# Patient Record
Sex: Female | Born: 1975 | Race: White | Hispanic: No | Marital: Single | Smoking: Current every day smoker
Health system: Southern US, Community
[De-identification: ages and names within clinical notes are randomized; demographics above are authoritative.]

## PROBLEM LIST (undated history)

## (undated) DIAGNOSIS — M47819 Spondylosis without myelopathy or radiculopathy, site unspecified: Secondary | ICD-10-CM

## (undated) DIAGNOSIS — E079 Disorder of thyroid, unspecified: Secondary | ICD-10-CM

## (undated) DIAGNOSIS — F319 Bipolar disorder, unspecified: Secondary | ICD-10-CM

## (undated) DIAGNOSIS — M545 Low back pain, unspecified: Secondary | ICD-10-CM

## (undated) DIAGNOSIS — F1110 Opioid abuse, uncomplicated: Secondary | ICD-10-CM

## (undated) DIAGNOSIS — K219 Gastro-esophageal reflux disease without esophagitis: Secondary | ICD-10-CM

## (undated) DIAGNOSIS — M1990 Unspecified osteoarthritis, unspecified site: Secondary | ICD-10-CM

## (undated) DIAGNOSIS — F32A Depression, unspecified: Secondary | ICD-10-CM

## (undated) DIAGNOSIS — J449 Chronic obstructive pulmonary disease, unspecified: Secondary | ICD-10-CM

## (undated) DIAGNOSIS — G8929 Other chronic pain: Secondary | ICD-10-CM

## (undated) DIAGNOSIS — F419 Anxiety disorder, unspecified: Secondary | ICD-10-CM

## (undated) DIAGNOSIS — E538 Deficiency of other specified B group vitamins: Secondary | ICD-10-CM

## (undated) HISTORY — DX: Opioid abuse, uncomplicated: F11.10

## (undated) HISTORY — PX: TUBAL LIGATION: SHX77

## (undated) HISTORY — PX: CRYOABLATION: SHX1415

## (undated) HISTORY — DX: Disorder of thyroid, unspecified: E07.9

## (undated) HISTORY — DX: Deficiency of other specified B group vitamins: E53.8

## (undated) HISTORY — DX: Anxiety disorder, unspecified: F41.9

## (undated) HISTORY — DX: Depression, unspecified: F32.A

## (undated) HISTORY — PX: DENTAL SURGERY: SHX609

## (undated) HISTORY — DX: Spondylosis without myelopathy or radiculopathy, site unspecified: M47.819

## (undated) HISTORY — DX: Bipolar disorder, unspecified: F31.9

---

## 1998-05-20 ENCOUNTER — Emergency Department (HOSPITAL_COMMUNITY): Admission: EM | Admit: 1998-05-20 | Discharge: 1998-05-20 | Payer: Self-pay | Admitting: Emergency Medicine

## 2000-06-30 ENCOUNTER — Encounter: Payer: Self-pay | Admitting: Preventative Medicine

## 2000-06-30 ENCOUNTER — Ambulatory Visit (HOSPITAL_COMMUNITY): Admission: RE | Admit: 2000-06-30 | Discharge: 2000-06-30 | Payer: Self-pay | Admitting: Preventative Medicine

## 2001-01-31 ENCOUNTER — Emergency Department (HOSPITAL_COMMUNITY): Admission: EM | Admit: 2001-01-31 | Discharge: 2001-01-31 | Payer: Self-pay | Admitting: Emergency Medicine

## 2001-11-14 ENCOUNTER — Emergency Department (HOSPITAL_COMMUNITY): Admission: EM | Admit: 2001-11-14 | Discharge: 2001-11-14 | Payer: Self-pay | Admitting: Emergency Medicine

## 2002-08-06 ENCOUNTER — Emergency Department (HOSPITAL_COMMUNITY): Admission: EM | Admit: 2002-08-06 | Discharge: 2002-08-06 | Payer: Self-pay | Admitting: Internal Medicine

## 2002-09-30 ENCOUNTER — Emergency Department (HOSPITAL_COMMUNITY): Admission: EM | Admit: 2002-09-30 | Discharge: 2002-09-30 | Payer: Self-pay | Admitting: *Deleted

## 2002-11-11 ENCOUNTER — Emergency Department (HOSPITAL_COMMUNITY): Admission: EM | Admit: 2002-11-11 | Discharge: 2002-11-11 | Payer: Self-pay | Admitting: Emergency Medicine

## 2002-12-22 ENCOUNTER — Emergency Department (HOSPITAL_COMMUNITY): Admission: EM | Admit: 2002-12-22 | Discharge: 2002-12-23 | Payer: Self-pay | Admitting: Emergency Medicine

## 2003-03-10 ENCOUNTER — Emergency Department (HOSPITAL_COMMUNITY): Admission: EM | Admit: 2003-03-10 | Discharge: 2003-03-10 | Payer: Self-pay | Admitting: Emergency Medicine

## 2003-10-13 ENCOUNTER — Emergency Department (HOSPITAL_COMMUNITY): Admission: EM | Admit: 2003-10-13 | Discharge: 2003-10-14 | Payer: Self-pay | Admitting: Emergency Medicine

## 2003-11-19 ENCOUNTER — Emergency Department (HOSPITAL_COMMUNITY): Admission: EM | Admit: 2003-11-19 | Discharge: 2003-11-19 | Payer: Self-pay | Admitting: *Deleted

## 2004-04-28 ENCOUNTER — Inpatient Hospital Stay (HOSPITAL_COMMUNITY): Admission: RE | Admit: 2004-04-28 | Discharge: 2004-04-30 | Payer: Self-pay | Admitting: Obstetrics and Gynecology

## 2004-05-12 ENCOUNTER — Emergency Department (HOSPITAL_COMMUNITY): Admission: EM | Admit: 2004-05-12 | Discharge: 2004-05-12 | Payer: Self-pay | Admitting: Emergency Medicine

## 2004-05-30 ENCOUNTER — Emergency Department (HOSPITAL_COMMUNITY): Admission: EM | Admit: 2004-05-30 | Discharge: 2004-05-30 | Payer: Self-pay | Admitting: Emergency Medicine

## 2004-06-17 ENCOUNTER — Emergency Department (HOSPITAL_COMMUNITY): Admission: EM | Admit: 2004-06-17 | Discharge: 2004-06-17 | Payer: Self-pay | Admitting: Emergency Medicine

## 2004-08-18 ENCOUNTER — Emergency Department (HOSPITAL_COMMUNITY): Admission: EM | Admit: 2004-08-18 | Discharge: 2004-08-18 | Payer: Self-pay | Admitting: Emergency Medicine

## 2004-09-08 ENCOUNTER — Ambulatory Visit (HOSPITAL_COMMUNITY): Admission: RE | Admit: 2004-09-08 | Discharge: 2004-09-08 | Payer: Self-pay | Admitting: General Surgery

## 2004-09-08 ENCOUNTER — Encounter: Payer: Self-pay | Admitting: Orthopedic Surgery

## 2005-01-17 ENCOUNTER — Observation Stay (HOSPITAL_COMMUNITY): Admission: EM | Admit: 2005-01-17 | Discharge: 2005-01-18 | Payer: Self-pay | Admitting: Emergency Medicine

## 2005-04-22 ENCOUNTER — Ambulatory Visit (HOSPITAL_COMMUNITY): Admission: AD | Admit: 2005-04-22 | Discharge: 2005-04-22 | Payer: Self-pay | Admitting: Obstetrics and Gynecology

## 2005-04-25 ENCOUNTER — Emergency Department (HOSPITAL_COMMUNITY): Admission: EM | Admit: 2005-04-25 | Discharge: 2005-04-25 | Payer: Self-pay | Admitting: Emergency Medicine

## 2005-05-12 ENCOUNTER — Inpatient Hospital Stay (HOSPITAL_COMMUNITY): Admission: AD | Admit: 2005-05-12 | Discharge: 2005-05-14 | Payer: Self-pay | Admitting: Obstetrics and Gynecology

## 2005-05-17 ENCOUNTER — Emergency Department (HOSPITAL_COMMUNITY): Admission: EM | Admit: 2005-05-17 | Discharge: 2005-05-17 | Payer: Self-pay | Admitting: Emergency Medicine

## 2005-06-14 ENCOUNTER — Ambulatory Visit (HOSPITAL_COMMUNITY): Admission: RE | Admit: 2005-06-14 | Discharge: 2005-06-14 | Payer: Self-pay | Admitting: Obstetrics and Gynecology

## 2005-11-26 ENCOUNTER — Emergency Department (HOSPITAL_COMMUNITY): Admission: EM | Admit: 2005-11-26 | Discharge: 2005-11-26 | Payer: Self-pay | Admitting: Emergency Medicine

## 2006-03-13 ENCOUNTER — Emergency Department (HOSPITAL_COMMUNITY): Admission: EM | Admit: 2006-03-13 | Discharge: 2006-03-13 | Payer: Self-pay | Admitting: Emergency Medicine

## 2006-03-19 ENCOUNTER — Emergency Department (HOSPITAL_COMMUNITY): Admission: EM | Admit: 2006-03-19 | Discharge: 2006-03-20 | Payer: Self-pay | Admitting: Emergency Medicine

## 2006-03-20 ENCOUNTER — Emergency Department (HOSPITAL_COMMUNITY): Admission: EM | Admit: 2006-03-20 | Discharge: 2006-03-20 | Payer: Self-pay | Admitting: Emergency Medicine

## 2006-04-23 ENCOUNTER — Emergency Department (HOSPITAL_COMMUNITY): Admission: EM | Admit: 2006-04-23 | Discharge: 2006-04-23 | Payer: Self-pay | Admitting: Emergency Medicine

## 2006-05-05 ENCOUNTER — Emergency Department (HOSPITAL_COMMUNITY): Admission: EM | Admit: 2006-05-05 | Discharge: 2006-05-05 | Payer: Self-pay | Admitting: Emergency Medicine

## 2006-06-03 ENCOUNTER — Emergency Department (HOSPITAL_COMMUNITY): Admission: EM | Admit: 2006-06-03 | Discharge: 2006-06-03 | Payer: Self-pay | Admitting: Emergency Medicine

## 2006-06-27 ENCOUNTER — Emergency Department (HOSPITAL_COMMUNITY): Admission: EM | Admit: 2006-06-27 | Discharge: 2006-06-27 | Payer: Self-pay | Admitting: Emergency Medicine

## 2006-07-15 ENCOUNTER — Emergency Department (HOSPITAL_COMMUNITY): Admission: EM | Admit: 2006-07-15 | Discharge: 2006-07-15 | Payer: Self-pay | Admitting: Emergency Medicine

## 2006-07-30 ENCOUNTER — Emergency Department (HOSPITAL_COMMUNITY): Admission: EM | Admit: 2006-07-30 | Discharge: 2006-07-30 | Payer: Self-pay | Admitting: Emergency Medicine

## 2006-12-09 ENCOUNTER — Emergency Department (HOSPITAL_COMMUNITY): Admission: EM | Admit: 2006-12-09 | Discharge: 2006-12-10 | Payer: Self-pay | Admitting: Emergency Medicine

## 2007-03-11 ENCOUNTER — Ambulatory Visit: Payer: Self-pay | Admitting: Orthopedic Surgery

## 2007-05-23 ENCOUNTER — Encounter
Admission: RE | Admit: 2007-05-23 | Discharge: 2007-05-23 | Payer: Self-pay | Admitting: Physical Medicine & Rehabilitation

## 2007-07-05 ENCOUNTER — Emergency Department (HOSPITAL_COMMUNITY): Admission: EM | Admit: 2007-07-05 | Discharge: 2007-07-05 | Payer: Self-pay | Admitting: Emergency Medicine

## 2007-10-31 ENCOUNTER — Emergency Department (HOSPITAL_COMMUNITY): Admission: EM | Admit: 2007-10-31 | Discharge: 2007-10-31 | Payer: Self-pay | Admitting: Emergency Medicine

## 2007-11-20 ENCOUNTER — Emergency Department (HOSPITAL_COMMUNITY): Admission: EM | Admit: 2007-11-20 | Discharge: 2007-11-20 | Payer: Self-pay | Admitting: Emergency Medicine

## 2008-02-03 ENCOUNTER — Emergency Department (HOSPITAL_COMMUNITY): Admission: EM | Admit: 2008-02-03 | Discharge: 2008-02-03 | Payer: Self-pay | Admitting: Emergency Medicine

## 2008-05-04 ENCOUNTER — Emergency Department (HOSPITAL_COMMUNITY): Admission: EM | Admit: 2008-05-04 | Discharge: 2008-05-04 | Payer: Self-pay | Admitting: Emergency Medicine

## 2008-05-19 ENCOUNTER — Emergency Department (HOSPITAL_COMMUNITY): Admission: EM | Admit: 2008-05-19 | Discharge: 2008-05-19 | Payer: Self-pay | Admitting: Emergency Medicine

## 2008-05-26 ENCOUNTER — Emergency Department (HOSPITAL_COMMUNITY): Admission: EM | Admit: 2008-05-26 | Discharge: 2008-05-26 | Payer: Self-pay | Admitting: Emergency Medicine

## 2008-07-28 ENCOUNTER — Emergency Department (HOSPITAL_COMMUNITY): Admission: EM | Admit: 2008-07-28 | Discharge: 2008-07-28 | Payer: Self-pay | Admitting: Emergency Medicine

## 2008-09-23 ENCOUNTER — Emergency Department (HOSPITAL_COMMUNITY): Admission: EM | Admit: 2008-09-23 | Discharge: 2008-09-23 | Payer: Self-pay | Admitting: Emergency Medicine

## 2008-09-29 ENCOUNTER — Emergency Department (HOSPITAL_COMMUNITY): Admission: EM | Admit: 2008-09-29 | Discharge: 2008-09-29 | Payer: Self-pay | Admitting: Emergency Medicine

## 2008-11-10 ENCOUNTER — Emergency Department (HOSPITAL_COMMUNITY): Admission: EM | Admit: 2008-11-10 | Discharge: 2008-11-10 | Payer: Self-pay | Admitting: Emergency Medicine

## 2009-02-27 ENCOUNTER — Emergency Department (HOSPITAL_COMMUNITY): Admission: EM | Admit: 2009-02-27 | Discharge: 2009-02-27 | Payer: Self-pay | Admitting: Emergency Medicine

## 2009-04-28 ENCOUNTER — Emergency Department (HOSPITAL_COMMUNITY): Admission: EM | Admit: 2009-04-28 | Discharge: 2009-04-28 | Payer: Self-pay | Admitting: Emergency Medicine

## 2009-04-29 ENCOUNTER — Emergency Department (HOSPITAL_COMMUNITY): Admission: EM | Admit: 2009-04-29 | Discharge: 2009-04-29 | Payer: Self-pay | Admitting: Emergency Medicine

## 2009-06-03 ENCOUNTER — Emergency Department (HOSPITAL_COMMUNITY): Admission: EM | Admit: 2009-06-03 | Discharge: 2009-06-03 | Payer: Self-pay | Admitting: Emergency Medicine

## 2010-01-25 IMAGING — CR DG CHEST 2V
2 series · 2 of 2 positions shown · non-contrast
Comparison: 05/19/2008

CLINICAL DATA: Sore throat.  Cough.  Fever.

CHEST - 2 VIEW

[view not recorded (1 of 2)]
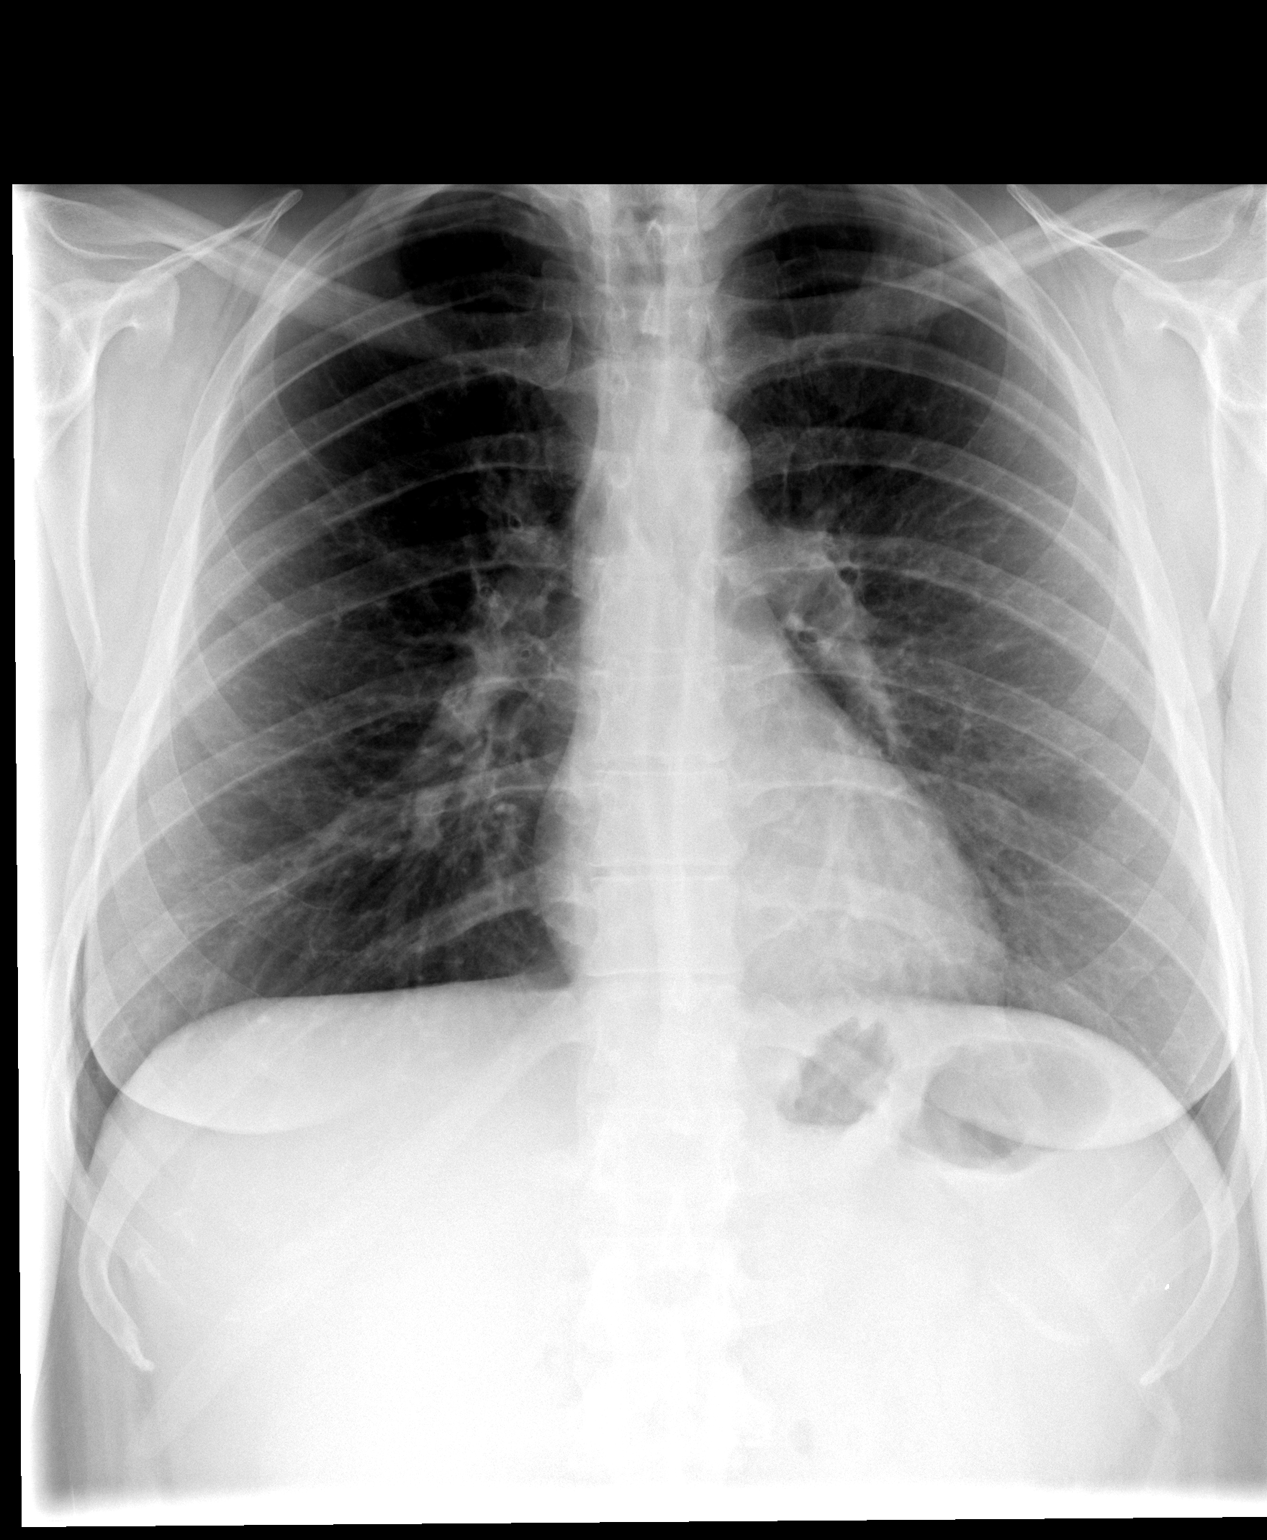

[view not recorded (2 of 2)]
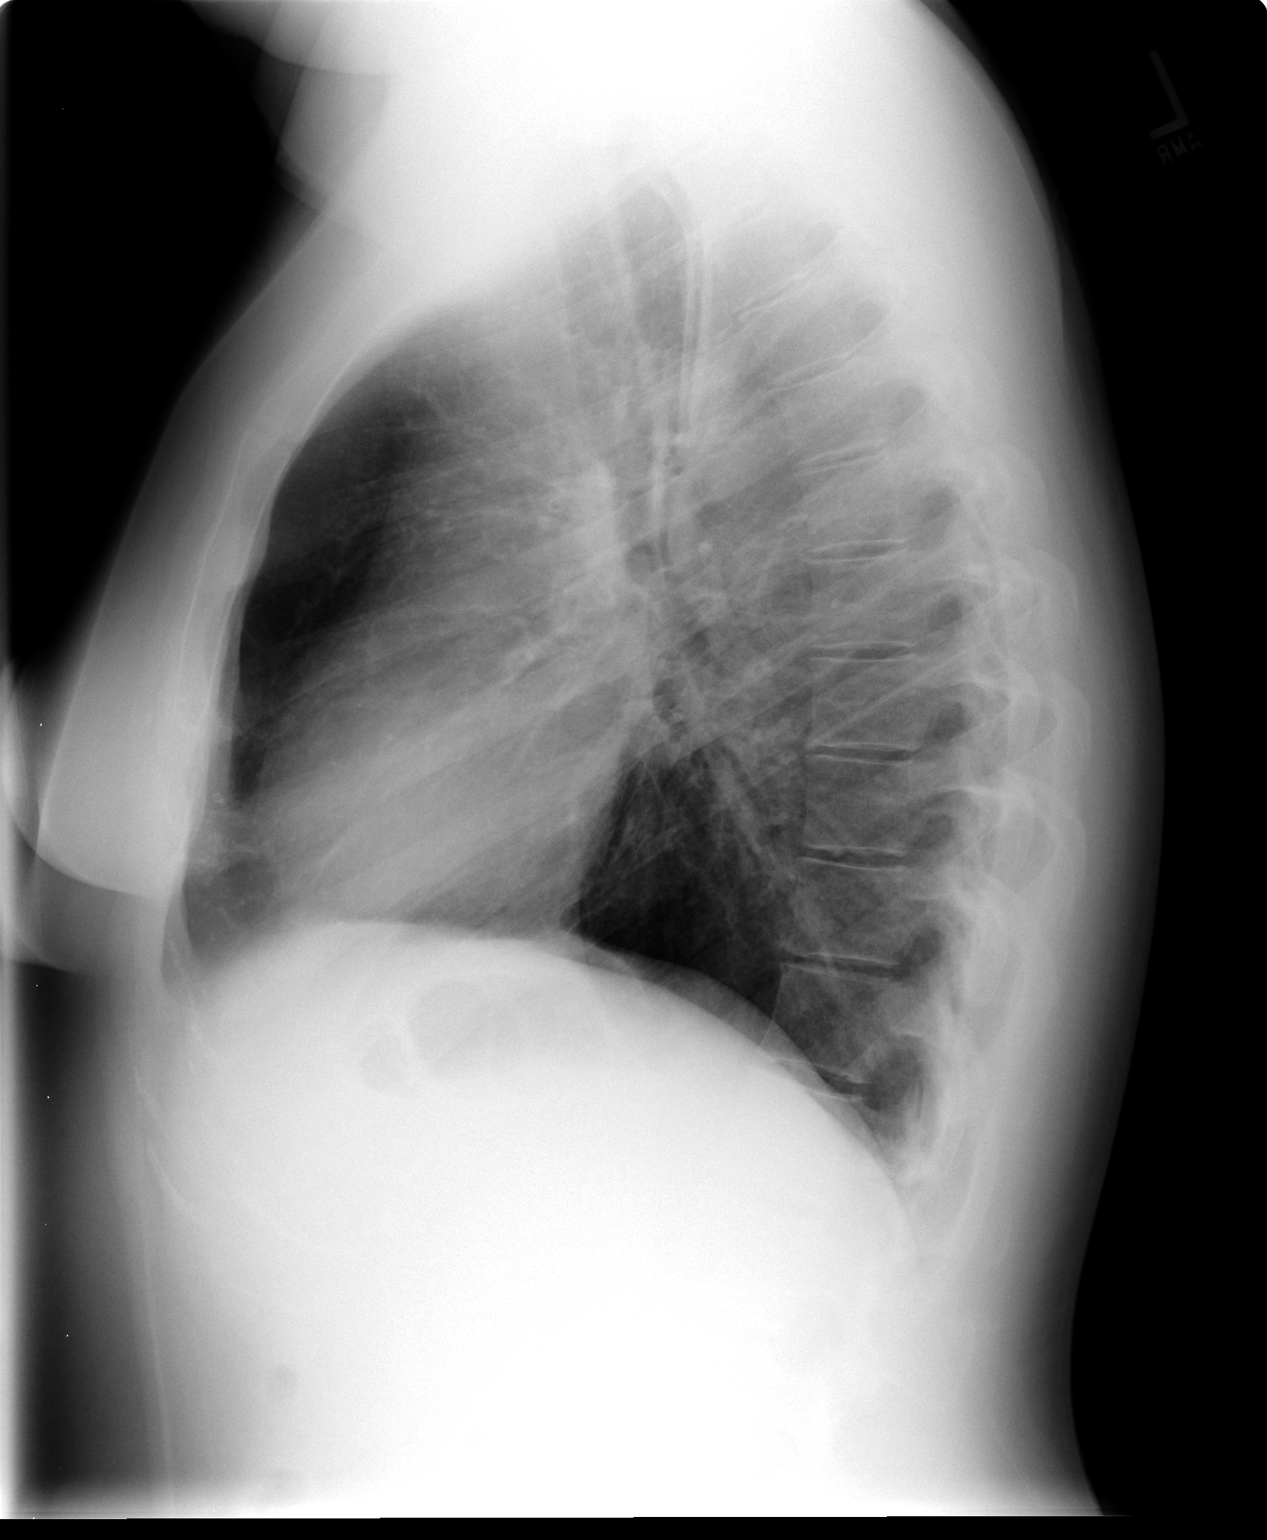

[2 of 2 positions shown; findings below may reference images not displayed]

FINDINGS: Moderate hyperaeration of the lungs.  Findings may
represent COPD.  No infiltrate, consolidation, or atelectasis.
Normal cardiomediastinal silhouette.
IMPRESSION: No acute chest findings.  Query COPD.

## 2010-01-28 ENCOUNTER — Encounter: Payer: Self-pay | Admitting: Internal Medicine

## 2010-01-29 ENCOUNTER — Encounter: Payer: Self-pay | Admitting: Preventative Medicine

## 2010-01-29 ENCOUNTER — Encounter: Payer: Self-pay | Admitting: Family Medicine

## 2010-01-31 IMAGING — CR DG CHEST 2V
2 series · 2 of 2 positions shown · non-contrast
Comparison: 09/23/2008

CLINICAL DATA: CHEST - 2 VIEW

[view not recorded (1 of 2)]
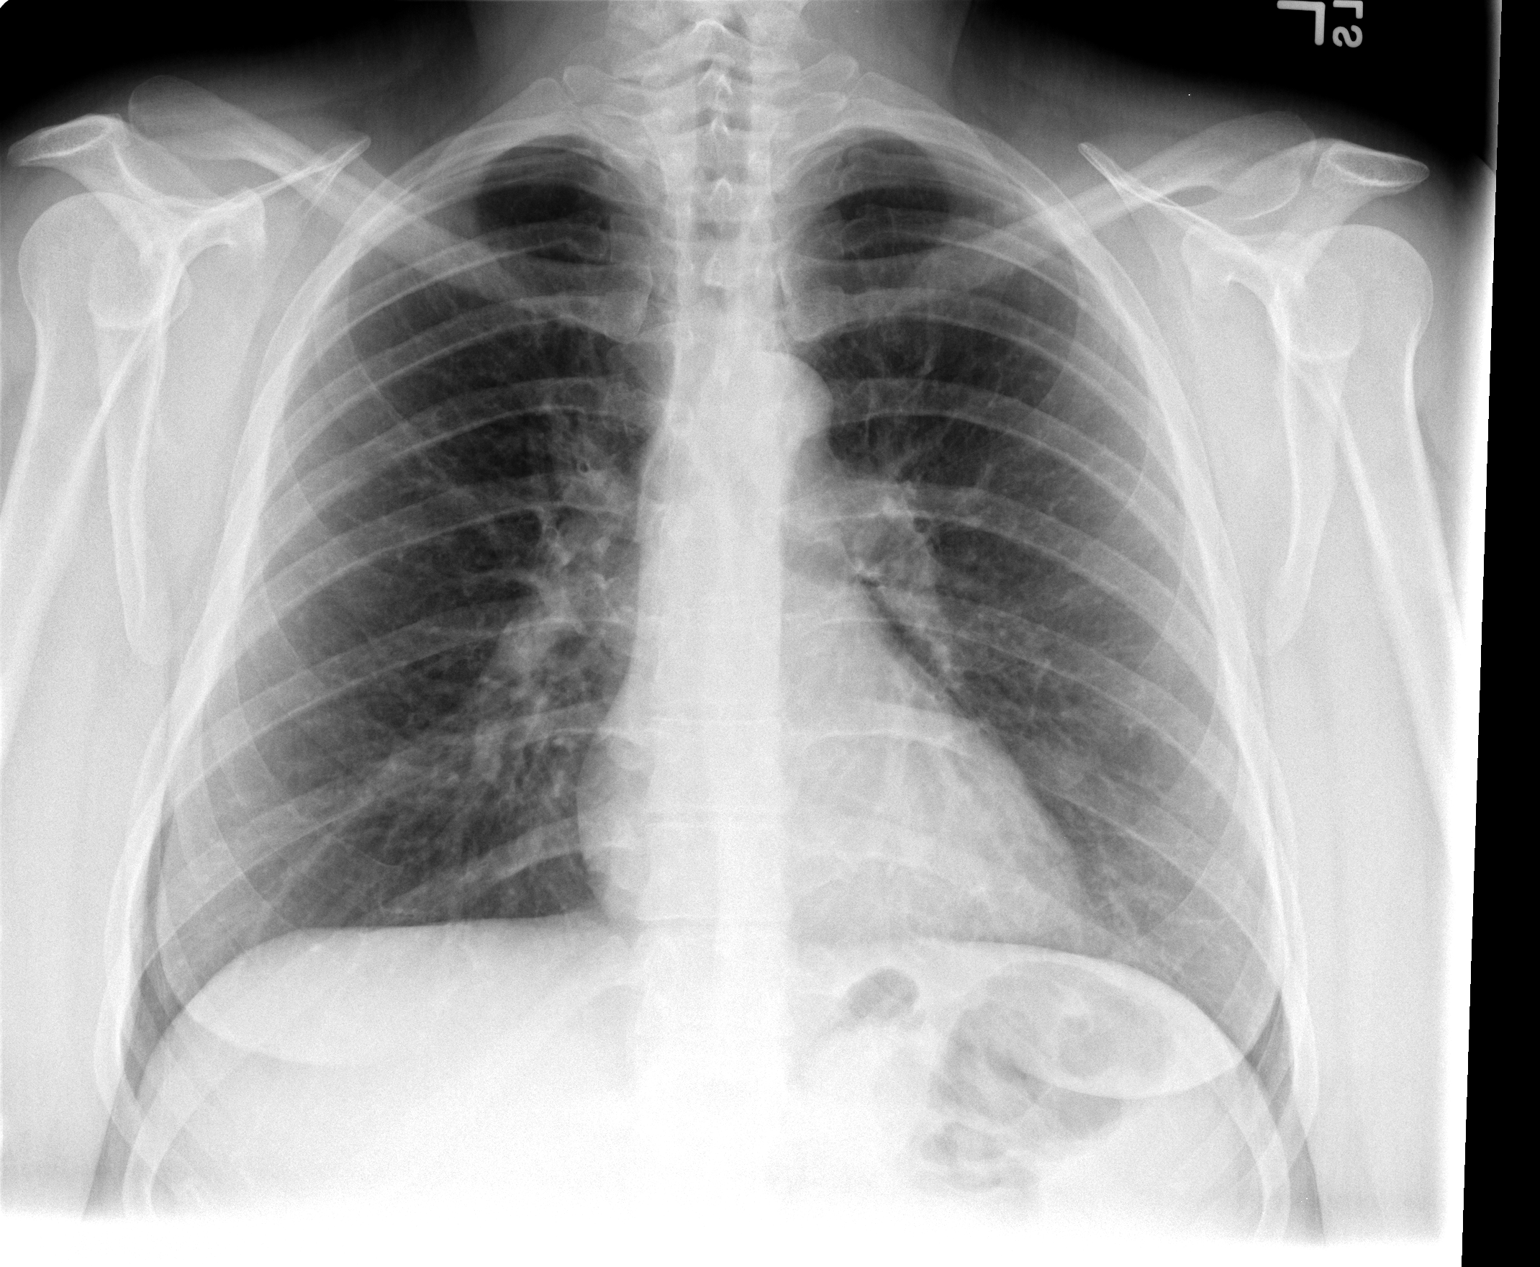

[view not recorded (2 of 2)]
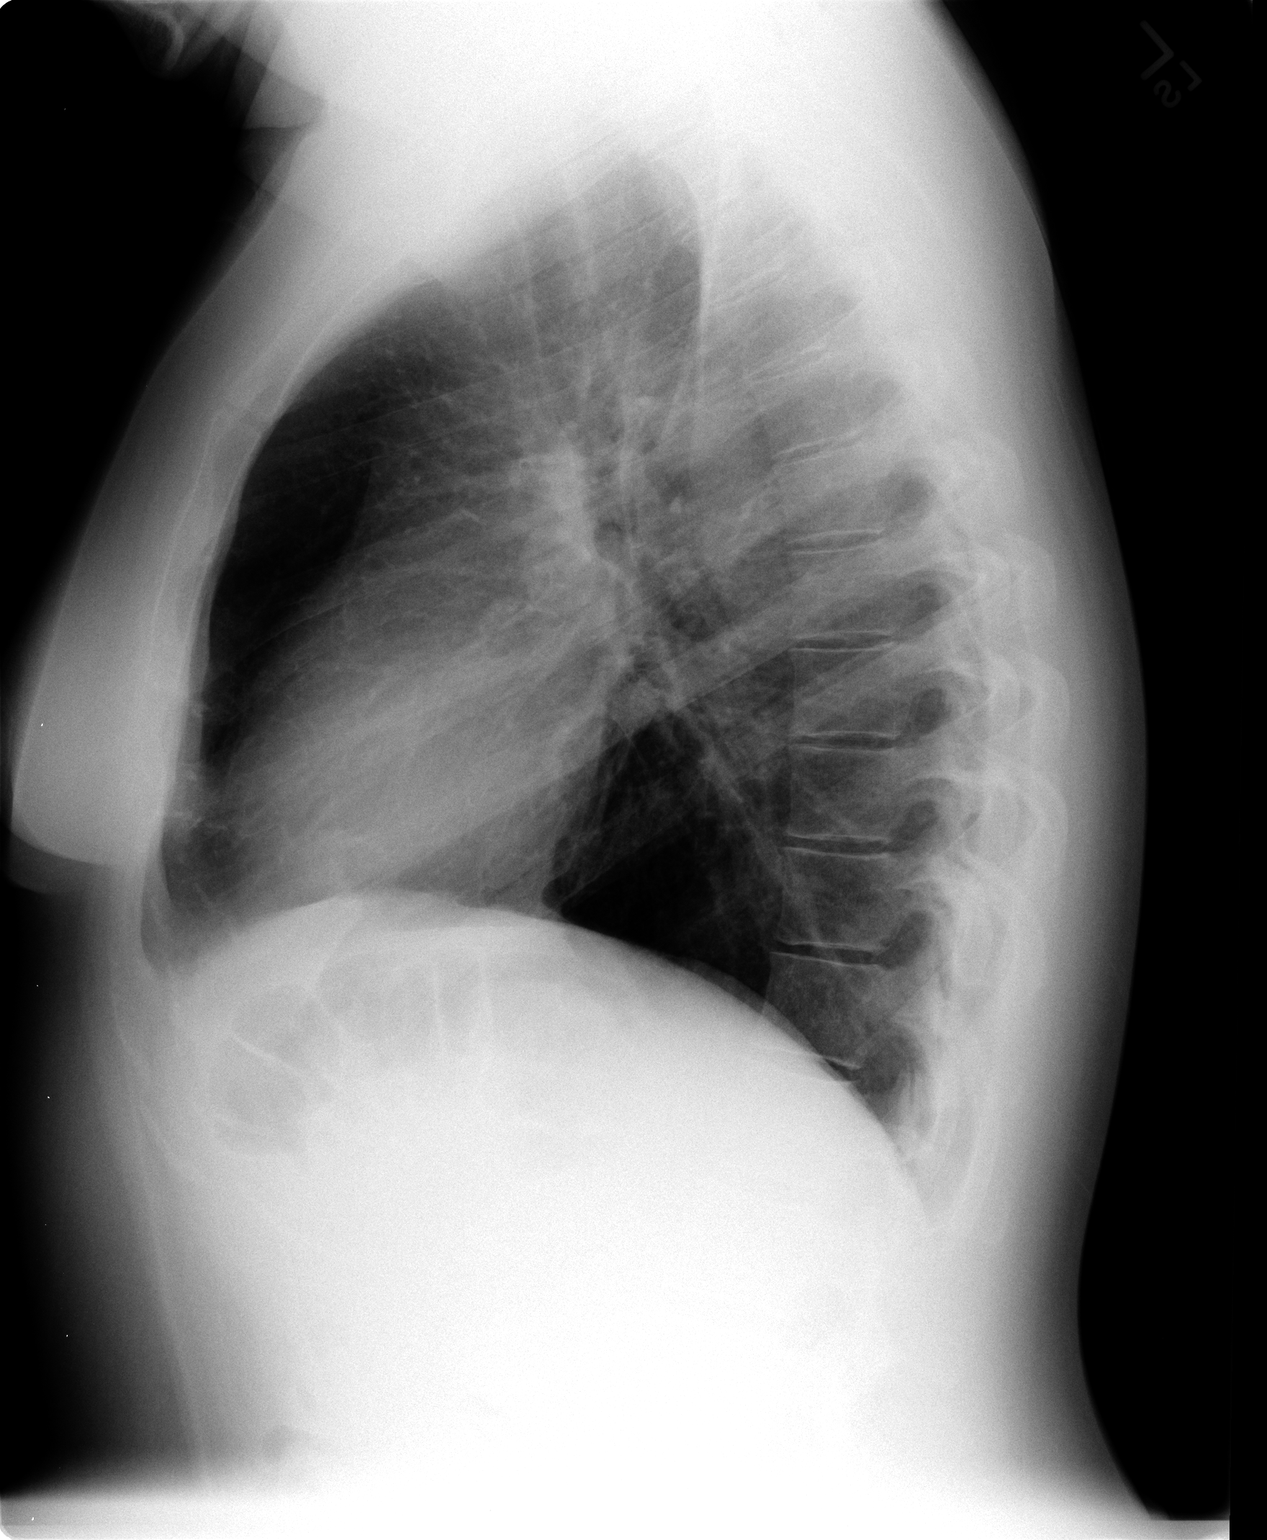

[2 of 2 positions shown; findings below may reference images not displayed]

FINDINGS: Normal heart size.  Chronic bronchitic changes with
bronchial wall thickening.  No active infiltrate.  Moderate
hyperaeration of the lungs raises a question of COPD.
IMPRESSION: Suspicion for COPD.  Chronic bronchitic changes.

## 2010-03-14 ENCOUNTER — Emergency Department (HOSPITAL_COMMUNITY)
Admission: EM | Admit: 2010-03-14 | Discharge: 2010-03-14 | Disposition: A | Discharge status: Home / Self Care | Payer: Medicaid Other | Attending: Emergency Medicine | Admitting: Emergency Medicine

## 2010-03-14 DIAGNOSIS — K089 Disorder of teeth and supporting structures, unspecified: Secondary | ICD-10-CM | POA: Insufficient documentation

## 2010-03-14 DIAGNOSIS — R197 Diarrhea, unspecified: Secondary | ICD-10-CM | POA: Insufficient documentation

## 2010-03-14 DIAGNOSIS — K5289 Other specified noninfective gastroenteritis and colitis: Secondary | ICD-10-CM | POA: Insufficient documentation

## 2010-03-14 DIAGNOSIS — K029 Dental caries, unspecified: Secondary | ICD-10-CM | POA: Insufficient documentation

## 2010-03-14 DIAGNOSIS — F319 Bipolar disorder, unspecified: Secondary | ICD-10-CM | POA: Insufficient documentation

## 2010-03-14 DIAGNOSIS — F411 Generalized anxiety disorder: Secondary | ICD-10-CM | POA: Insufficient documentation

## 2010-03-29 LAB — CBC
HCT: 38.9 % (ref 36.0–46.0)
Hemoglobin: 13.4 g/dL (ref 12.0–15.0)
MCHC: 34.5 g/dL (ref 30.0–36.0)
MCV: 93.2 fL (ref 78.0–100.0)
RBC: 4.17 MIL/uL (ref 3.87–5.11)
WBC: 8.9 10*3/uL (ref 4.0–10.5)

## 2010-03-29 LAB — DIFFERENTIAL
Basophils Relative: 2 % — ABNORMAL HIGH (ref 0–1)
Eosinophils Absolute: 0.5 10*3/uL (ref 0.0–0.7)
Lymphocytes Relative: 47 % — ABNORMAL HIGH (ref 12–46)
Lymphs Abs: 4.2 10*3/uL — ABNORMAL HIGH (ref 0.7–4.0)

## 2010-03-29 LAB — BASIC METABOLIC PANEL
CO2: 30 mEq/L (ref 19–32)
Creatinine, Ser: 0.78 mg/dL (ref 0.4–1.2)
Potassium: 4 mEq/L (ref 3.5–5.1)

## 2010-03-29 LAB — POCT CARDIAC MARKERS
CKMB, poc: <1 ng/mL — ABNORMAL LOW (ref 1.0–8.0)
Myoglobin, poc: 42.5 ng/mL (ref 12–200)

## 2010-04-05 ENCOUNTER — Emergency Department (HOSPITAL_COMMUNITY)
Admission: EM | Admit: 2010-04-05 | Discharge: 2010-04-05 | Disposition: A | Discharge status: Home / Self Care | Payer: Medicaid Other | Attending: Emergency Medicine | Admitting: Emergency Medicine

## 2010-04-05 DIAGNOSIS — R220 Localized swelling, mass and lump, head: Secondary | ICD-10-CM | POA: Insufficient documentation

## 2010-04-05 DIAGNOSIS — F411 Generalized anxiety disorder: Secondary | ICD-10-CM | POA: Insufficient documentation

## 2010-04-05 DIAGNOSIS — K089 Disorder of teeth and supporting structures, unspecified: Secondary | ICD-10-CM | POA: Insufficient documentation

## 2010-04-05 DIAGNOSIS — R234 Changes in skin texture: Secondary | ICD-10-CM | POA: Insufficient documentation

## 2010-04-05 DIAGNOSIS — R221 Localized swelling, mass and lump, neck: Secondary | ICD-10-CM | POA: Insufficient documentation

## 2010-04-05 DIAGNOSIS — F319 Bipolar disorder, unspecified: Secondary | ICD-10-CM | POA: Insufficient documentation

## 2010-04-05 DIAGNOSIS — K029 Dental caries, unspecified: Secondary | ICD-10-CM | POA: Insufficient documentation

## 2010-04-05 DIAGNOSIS — K0510 Chronic gingivitis, plaque induced: Secondary | ICD-10-CM | POA: Insufficient documentation

## 2010-04-18 LAB — RAPID STREP SCREEN (MED CTR MEBANE ONLY): Streptococcus, Group A Screen (Direct): NEGATIVE

## 2010-05-26 NOTE — Op Note (Signed)
NAMEKATLEN, SEYER             ACCOUNT NO.:  [id number]   MEDICAL RECORD NO.:  [id number]          PATIENT TYPE:  INP   LOCATION:  A401                          FACILITY:  [facility]   PHYSICIAN:  Tilda Burrow, M.D. DATE OF BIRTH:  1975-02-18   DATE OF PROCEDURE:  04/28/2004  DATE OF DISCHARGE:                                 OPERATIVE REPORT   PROCEDURE:  Epidural catheter placement.   DETAILS OF PROCEDURE:  The patient labored quite some time, eventually  reaching 5 cm dilated, having a strong urge to push. Continuous lumbar  epidural catheter was placed using loss-of-resistance technique at L2-3  interspace without difficulty using loss-of-resistance technique identifying  the epidural space at 5 cm depth with easy introduction of 3 cm of catheter  into the epidural space, after a 5 cc test dose of 1.5% lidocaine with  epinephrine was instilled. A bolus of 10 cc of the 0.125% Marcaine solution  was infused, with symmetrical analgesic effect to T10 dermatome level  achieved. We then had continuous effusion of the epidural catheter infusion  mix at 12 cc per hour, resulting good analgesia during the labor process.  The exam after the epidural was placed showed her to be 7 cm dilated, +1  station. Prognosis for vaginal delivery was excellent.      JVF/MEDQ  D:  04/29/2004  T:  04/29/2004  Job:  [id number]

## 2010-05-26 NOTE — Op Note (Signed)
NAME:  Shannon Reese, Shannon Reese             ACCOUNT NO.:  [id number]   MEDICAL RECORD NO.:  [id number]          PATIENT TYPE:  AMB   LOCATION:  DAY                           FACILITY:  [facility]   PHYSICIAN:  Tilda Burrow, M.D. DATE OF BIRTH:  13-Sep-1975   DATE OF PROCEDURE:  DATE OF DISCHARGE:                                 OPERATIVE REPORT   PREOPERATIVE DIAGNOSIS:  Elective sterilization.   POSTOPERATIVE DIAGNOSIS:  Elective sterilization.   PROCEDURE:  Laparoscopic tubal sterilization, Falope ring.   SURGEON:  Tilda Burrow, M.D.   ASSISTANT:  None.   ANESTHESIA:  General.   COMPLICATIONS:  None.   FINDINGS:  Normal tubes and ovaries bilaterally.  No pelvic adhesions noted.   ESTIMATED BLOOD LOSS:  Minimal.   DETAILS OF PROCEDURE:  The patient was taken to the operating room, prepped  and draped in the usual standard fashion with legs in low lithotomy leg  supports after general anesthesia was introduced without difficulty.  The  bladder was in-and-out catheterized and Hulka tenaculum attached to the  cervix for uterine  manipulation.  An infraumbilical, vertical, 1-cm, skin  incision was made as well as a transverse suprapubic 1-cm incision. A Veress  needle was used to achieve pneumoperitoneum through the umbilical incision  while being careful to orient the needle toward the pelvis while elevating  the abdominal wall by manual elevation. Water droplet test was used to  confirm intraperitoneal placement.   Pneumoperitoneum was achieved easily under 8-to-10 mm of intra-abdominal  pressure; and the laparoscopic trocar was introduced, a 5-mm blunt tipped  trocar, under direct visualization using the video camera.  Peritoneal  cavity was entered without difficulty.  Inspection of the anterior surfaces  of the abdominal contents showed no evidence of injury or bleeding.  Attention was directed to the pelvis.  Findings were as described above.   Attention was first directed to  the left fallopian tube which was elevated,  identified to its fimbriated end and grasped in its mid portion with Falope  ring applier.  Falope ring applied and then the tube infiltrated with  Marcaine solution 0.25% using a 22-gauge spinal needle percutaneously  applied.   Attention was then directed to the right fallopian tube where a similar  procedure was performed.  Photo documentation of the ring placements was  performed; 120 cc of saline was instilled into the abdomen; deflation of  CO2 performed; instruments removed and subcuticular 4-0 Dexon closure of  skin incisions performed.  The rest of the surgical instruments were  removed; Steri-Strips placed.  The patient allowed to awaken and go to  recovery room in standard fashion.      Tilda Burrow, M.D.  Electronically Signed     JVF/MEDQ  D:  06/14/2005  T:  06/14/2005  Job:  [id number]

## 2010-05-26 NOTE — H&P (Signed)
NAMEKIMARI, LIENHARD             ACCOUNT NO.:  [id number]   MEDICAL RECORD NO.:  [id number]          PATIENT TYPE:  INP   LOCATION:  LDR1                          FACILITY:  [facility]   PHYSICIAN:  Lazaro Arms, M.D.   DATE OF BIRTH:  1975-03-29   DATE OF ADMISSION:  05/12/2005  DATE OF DISCHARGE:  LH                                HISTORY & PHYSICAL   Shannon Reese is a 35 year old white female, gravida 2, para 1 with estimated date  of delivery of May 26, 2005 at [redacted] weeks gestation who presented to labor and  delivery complaining of regular contractions.  Cervix was 6 cm.  She was  admitted in labor.   PAST MEDICAL HISTORY:  1.  Bipolar.  2.  Arthritis in back.   PAST SURGICAL HISTORY:  Negative.   ALLERGIES:  1.  PENICILLIN.  2.  KEFLEX.  3.  PREVACID.   MEDICATIONS:  Hydrocodone as needed.   OBSTETRICAL HISTORY:  She delivered back in April of 2006 at 38 weeks.   Blood type is O negative.  Antibody screen is negative.  Hepatitis B  negative.  HIV is nonreactive.  HSV is positive for 1 and 2.  She was  suppressed.  Serology is nonreactive.  Pap is normal.  GC and Chlamydia were  negative.  Repeat was negative.  Group B strep was negative.  Glucola was  normal.   IMPRESSION:  1.  [redacted] weeks gestation.  2.  Active labor.   PLAN:  The patient is admitted for expectant management of labor.      Lazaro Arms, M.D.  Electronically Signed    LHE/MEDQ  D:  05/12/2005  T:  05/12/2005  Job:  [id number]

## 2010-05-26 NOTE — Op Note (Signed)
NAMECAMARY, SOSA             ACCOUNT NO.:  [id number]   MEDICAL RECORD NO.:  [id number]          PATIENT TYPE:  INP   LOCATION:  A413                          FACILITY:  [facility]   PHYSICIAN:  Lazaro Arms, M.D.   DATE OF BIRTH:  [birth year]   DATE OF PROCEDURE:  05/12/2005  DATE OF DISCHARGE:                                 OPERATIVE REPORT   PROCEDURE PERFORMED:  Epidural.   Shannon Reese was admitted in active phase of labor. She requests an epidural to  be placed.  She is 6 cm.  She was placed in sitting position. Betadine prep  was used.  1% lidocaine was injected in the L3-L4 interspace.  A 17 gauge  Tuohy needle was used.  Loss of resistance technique employed and the  epidural space was found with one pass without difficulty.  10 mL of 0.125%  bupivacaine plain was given as a test dose without ill effects.  Epidural  catheter was fed 5 cm into the epidural space.  An additional 10 mL was  given to dose up the epidural.  The patient tolerated it well, was getting  good pain relief.  Fetal heart rate tracing stable.      Lazaro Arms, M.D.  Electronically Signed     LHE/MEDQ  D:  05/12/2005  T:  05/14/2005  Job:  [id number]

## 2010-05-26 NOTE — H&P (Signed)
NAME:  Shannon Reese, Shannon Reese             ACCOUNT NO.:  [id number]   MEDICAL RECORD NO.:  [id number]          PATIENT TYPE:  AMB   LOCATION:  DAY                           FACILITY:  [facility]   PHYSICIAN:  Tilda Burrow, M.D. DATE OF BIRTH:  October 23, 1975   DATE OF ADMISSION:  DATE OF DISCHARGE:  LH                                HISTORY & PHYSICAL   ADMITTING DIAGNOSIS:  Desire for elective permanent sterilization.   HISTORY OF PRESENT ILLNESS:  This 35 year old female recently 1 month  postpartum is admitted at this time for elective permanent sterilization.  She is a gravida 2, para 2 with an uncomplicated postpartum course who  signed Medicaid sterilization consents in March and confirms her continued  desire for permanent sterilization.  She is aware of the permanency of the  procedure and knows there are other options that are non-permanent.   PAST MEDICAL HISTORY:  Bipolar disorder.   SURGICAL HISTORY:  Negative.   ALLERGIES:  1.  PENICILLIN.  2.  KEFLEX.  3.  PREVACID.   MEDICATIONS:  Hydrocodone p.r.n.   PHYSICAL EXAMINATION:  GENERAL:  She is a healthy, large-framed Caucasian  female, alert and oriented x3.  VITAL SIGNS:  Weight 211, blood pressure 130/64.  HEENT:  Pupils equal, round, and reactive.  Extraocular movements intact.  NECK:  Supple, trachea midline.  CHEST:  Clear to auscultation.  ABDOMEN:  Nontender.  PELVIC:  External genitalia multiparous.  Cervix normal, well healed  postpartum.  Uterus anterior, adnexa nontender.   IMPRESSION:  Elective permanent sterilization.   PLAN:  Laparoscopic tubal sterilization with Falope rings June 14, 2005 at  7:30 a.m. [facility] short-stay center.      Tilda Burrow, M.D.  Electronically Signed     JVF/MEDQ  D:  06/11/2005  T:  06/11/2005  Job:  [id number]   cc:   Short Stay Center

## 2010-05-26 NOTE — Op Note (Signed)
NAMECASILDA, Reese             ACCOUNT NO.:  [id number]   MEDICAL RECORD NO.:  [id number]          PATIENT TYPE:  INP   LOCATION:  A401                          FACILITY:  [facility]   PHYSICIAN:  Tilda Burrow, M.D. DATE OF BIRTH:  1975/11/06   DATE OF PROCEDURE:  DATE OF DISCHARGE:                                 OPERATIVE REPORT   DELIVERY NOTE:  [name] progressed to completely dilated. She pushed for  over an hour with somewhat ineffectual progress due to the excellent  epidural block. She had the catheter discontinued so that we then were able  to achieve progress. She reached crowning position, and after evacuation of  large amount of fecal material, she was able to then push the baby out over  an intact perineum delivering a healthy 6 pound _4_ ounce female infant,  Apgars 9 and 9, delivered without difficulty. Amniotic fluid was clear.  There was nuchal cord x3 which had surprisingly had not bothered as much  during the labor with only an occasional mild variable deceleration with  good recovery. The umbilical cord was also unusually long.   The infant was delivered, cord clamped and cut, and after milking a part of  the cord toward the baby, and then the placenta delivered intact, [name]  presentation. Perineum did not require any repair.      JVF/MEDQ  D:  04/29/2004  T:  04/29/2004  Job:  [id number]

## 2010-05-26 NOTE — H&P (Signed)
[name], Reese             ACCOUNT NO.:  [id number]   MEDICAL RECORD NO.:  [id number]          PATIENT TYPE:  INP   LOCATION:  LDR1                          FACILITY:  [facility]   PHYSICIAN:  Tilda Burrow, M.D. DATE OF BIRTH:  02/15/1975   DATE OF ADMISSION:  04/28/2004  DATE OF DISCHARGE:  LH                                HISTORY & PHYSICAL   CHIEF COMPLAINT:  Spontaneous rupture of membranes at 0530 without labor.   HISTORY OF PRESENT ILLNESS:  Shannon Reese is a 35 year old gravida 1, para 0 with  an EDC of May 14, 2004 based on last menstrual period and May 16 and May 4 on  her first and second trimester ultrasounds with a consensus EDC of May 14, 2004 placing her at 37-5/[redacted] weeks gestational age. She began prenatal care  early in her first trimester and has had regular visits throughout. Prenatal  labs include blood type O negative. She did receive RhoGAM at 28 weeks. HB,  SAG, HIV, RPR are all negative. She did have a positive serology at HSV 1  and 2. She is asymptomatic. GBS is not on this chart. We will look that up  and treat if appropriate. Blood pressures have 100s to 130s over 60s to 80s.  Total weight gain has been about 45 pounds. Fundal height growth has been  appropriate.   PAST MEDICAL HISTORY:  1.  She is bipolar. She had been on Lamictal; however, she quit taking it      during her pregnancy.  2.  Heart murmur.   PAST SURGICAL HISTORY:  .   ALLERGIES:  PENICILLIN.   SOCIAL HISTORY:  She is single, unemployed, lives with her mother. Father of  the baby is present and supportive; however, he is reported to be addicted  to drugs. She denies alcohol and drug use and smokes about half a pack of  cigarettes a day. She did have positive THC in her first urine screen.   FAMILY HISTORY:  Noncontributory.   PHYSICAL EXAMINATION:  HEENT:  Within normal limits.  LUNGS:  Clear.  HEART:  Regular rate and rhythm. No murmur auscultated.  ABDOMEN:  Soft and nontender.  Just a few mild contractions.  PELVIC:  Cervix is 1 cm, very thick, -2 station, vertex presentation,  leaking clear fluid. No lesions are noted after careful inspection. Legs are  trace edema. Fetal heart rate is reactive without decelerations.   IMPRESSION:  Intrauterine pregnancy at 37-5/7. PROM. Will plan to start  Pitocin to get some contractions going. We will provide GBS prophylaxis if  appropriate.      FC/MEDQ  D:  04/28/2004  T:  04/28/2004  Job:  [id number]

## 2010-05-26 NOTE — Group Therapy Note (Signed)
NAME:  Shannon Reese, Shannon Reese             ACCOUNT NO.:  [id number]   MEDICAL RECORD NO.:  [id number]          PATIENT TYPE:  INP   LOCATION:  A401                          FACILITY:  [facility]   PHYSICIAN:  Tilda Burrow, M.D. DATE OF BIRTH:  Apr 24, 1975   DATE OF PROCEDURE:  DATE OF DISCHARGE:                                   PROGRESS NOTE   This progress note is designed to attempt to clarify concerns regarding  security and risks to employees and personnel regarding Ms. Lacora D.  Acquisto' family.   Giana delivered yesterday and was accompanied during the labor by her  boyfriend, Ardeen Fillers, a 1 year old individual. The family  relationships are apparently complicated in that Roniece Schlemmer' mother is  currently married to Verdi Porterfield's father.  As stated earlier, Joey  Porterfield's father was present at the delivery.   Throughout the labor process yesterday, Ardeen Fillers and his father were  the 2 main support individuals during the labor, handled themselves  appropriately, and there were no troublesome interactions with either of the  individuals.   According to conversations with Onalee Hua of hospital security, there had been  allegations last week that Ardeen Fillers was dangerous and  that Mr. Ardeen Fillers might appear at the hospital with a weapon with plans to harm  Saundria Henley' grandmother, an 29 year old woman.  In discussing that  allegation with Laiken this morning in the presence of Roetta Sessions, it  appears that there is a long-standing feud between Saint Martin' 84-year-  old grandmother and both Porterfield men.  Jaslin reports that her  grandmother disapproves of both Porterfield males and Analie's opinion is  that her _grandmother_ is either demented or flawed in her thinking.  Family  legends include the possibility that the grandmother smothered an infant  that would have been Dashley's sibling. There is no information to  corroborate that available to me at this time.   In direct questioning to Mr. Porterfield if he had, at any time, considered  bringing a weapon to the hospital or had any intentions to bring the weapon  to the hospital; he very calmly stated that he had never intended any such  action, never planned any action, had no reason to consider such an action  and that any stories to that affect, he considered to be made-up.   Both Meghann and Joey Porterfield maintained a calm demeanor throughout this  discussion and stated it was their goal simply to come in, deliver the baby,  have a time of calm, to simply take the baby home and go on about their  lives without causing or being a part of any trouble. This interaction  seemed completely credible.   I took this information to Onalee Hua __, Engineer, materials, and asked him to come  into the room where the same story was repeated. Again, this was completely  credible.   It is my impression at this time that nothing about my interactions with Mr.  Maura Crandall, Ms. Bockrath or the elder Mr. Maura Crandall during yesterday's  labor or today's length conversations give me any reason  to suspect him as  being a risk at this time.  It is similarly, security's impression that the  stories told today during rounds seem credible.   This note is to be made a part of medical records for clarification of  issues regarding security.      JVF/MEDQ  D:  04/29/2004  T:  04/29/2004  Job:  [id number]

## 2010-05-26 NOTE — Discharge Summary (Signed)
Shannon Reese, Shannon Reese             ACCOUNT NO.:  [id number]   MEDICAL RECORD NO.:  [id number]          PATIENT TYPE:  OIB   LOCATION:  A415                          FACILITY:  [facility]   PHYSICIAN:  Tilda Burrow, M.D. DATE OF BIRTH:  [birth year]   DATE OF ADMISSION:  01/17/2005  DATE OF DISCHARGE:  01/11/2007LH                                 DISCHARGE SUMMARY   REASON FOR ADMISSION:  Shannon Reese is a 35 year old, G2, P1 at approximately 48  weeks' gestation with nausea, vomiting and diarrhea.  She thinks this is  probably a virus because several members of her immediate family have this.   PAST MEDICAL HISTORY:  1.  Arthritis.  2.  Bipolar.   PAST SURGICAL HISTORY:  Negative.   ALLERGIES:  PENICILLIN, KEFLEX , PREVACID.   PHYSICAL EXAMINATION:  VITAL SIGNS:  Stable.  Low grade-temperature of 99.6.   LABORATORY DATA AND X-RAY FINDINGS:  She does have many bacteria in her  urine and many leukocytes.   PLAN:  We are going to admit to observation, give IV hydration, IV  antiemetics.  Start her on some Macrobid for her UTI and discharge her home  this morning.  She is feeling much better.  She states that she is able to  tolerate liquid and foods again and desires discharge.   CONDITION ON DISCHARGE:  Discharged to home.   DISCHARGE MEDICATIONS:  1.  Phenergan suppositories 25 mg.  2.  Macrobid b.i.d. x10 days.      Shannon Reese, Shannon Reese      Tilda Burrow, M.D.  Electronically Signed    DL/MEDQ  D:  16/10/9602  T:  01/18/2005  Job:  [id number]   cc:   [facility]

## 2010-05-26 NOTE — Op Note (Signed)
NAME:  Shannon Reese, Shannon Reese             ACCOUNT NO.:  [id number]   MEDICAL RECORD NO.:  [id number]          PATIENT TYPE:  INP   LOCATION:  A413                          FACILITY:  [facility]   PHYSICIAN:  Lazaro Arms, M.D.   DATE OF BIRTH:  [birth year]   DATE OF PROCEDURE:  05/12/2005  DATE OF DISCHARGE:                                 OPERATIVE REPORT   DELIVERY NOTE:  [name] is a 35 year old gravida 3, para 1, who came in in  active phase of labor, had Pitocin augmentation in the plateau phase, pushed  x3 and over an intact perineum delivered a viable female infant at 1830,  Apgars of 9 and 9, weight 9 pounds and 10 ounces.  There was a 3-vessel  cord.  Cord blood and cord gases sent.  Placenta was normal, intact.  There  was a first degree laceration of the left and right vulva which were not  bleeding and not repaired.  The uterus was firm below the umbilicus.  Blood  loss from deliver was 250 mL.  Infant underwent routine neonatal  resuscitation.  Patient will undergo routine postpartum care.  Epidural  catheter was removed with the blue tip intact.      Lazaro Arms, M.D.  Electronically Signed     LHE/MEDQ  D:  05/12/2005  T:  05/14/2005  Job:  [id number]

## 2010-05-27 ENCOUNTER — Emergency Department (HOSPITAL_COMMUNITY)
Admission: EM | Admit: 2010-05-27 | Discharge: 2010-05-27 | Disposition: A | Discharge status: Home / Self Care | Payer: Medicaid Other | Attending: Emergency Medicine | Admitting: Emergency Medicine

## 2010-05-27 DIAGNOSIS — S335XXA Sprain of ligaments of lumbar spine, initial encounter: Secondary | ICD-10-CM | POA: Insufficient documentation

## 2010-05-27 DIAGNOSIS — X58XXXA Exposure to other specified factors, initial encounter: Secondary | ICD-10-CM | POA: Insufficient documentation

## 2010-07-31 ENCOUNTER — Ambulatory Visit (HOSPITAL_BASED_OUTPATIENT_CLINIC_OR_DEPARTMENT_OTHER): Payer: Medicaid Other | Admitting: Physical Medicine & Rehabilitation

## 2010-07-31 ENCOUNTER — Encounter: Payer: Medicaid Other | Attending: Physical Medicine & Rehabilitation

## 2010-07-31 DIAGNOSIS — M79609 Pain in unspecified limb: Secondary | ICD-10-CM | POA: Insufficient documentation

## 2010-07-31 DIAGNOSIS — R209 Unspecified disturbances of skin sensation: Secondary | ICD-10-CM

## 2010-08-30 ENCOUNTER — Emergency Department (HOSPITAL_COMMUNITY)
Admission: EM | Admit: 2010-08-30 | Discharge: 2010-08-30 | Disposition: A | Discharge status: Home / Self Care | Payer: Medicaid Other | Attending: Emergency Medicine | Admitting: Emergency Medicine

## 2010-08-30 ENCOUNTER — Encounter: Payer: Self-pay | Admitting: *Deleted

## 2010-08-30 DIAGNOSIS — J449 Chronic obstructive pulmonary disease, unspecified: Secondary | ICD-10-CM | POA: Insufficient documentation

## 2010-08-30 DIAGNOSIS — F17200 Nicotine dependence, unspecified, uncomplicated: Secondary | ICD-10-CM | POA: Insufficient documentation

## 2010-08-30 DIAGNOSIS — M545 Low back pain, unspecified: Secondary | ICD-10-CM | POA: Insufficient documentation

## 2010-08-30 DIAGNOSIS — R209 Unspecified disturbances of skin sensation: Secondary | ICD-10-CM | POA: Insufficient documentation

## 2010-08-30 DIAGNOSIS — M549 Dorsalgia, unspecified: Secondary | ICD-10-CM

## 2010-08-30 DIAGNOSIS — J4489 Other specified chronic obstructive pulmonary disease: Secondary | ICD-10-CM | POA: Insufficient documentation

## 2010-08-30 HISTORY — DX: Gastro-esophageal reflux disease without esophagitis: K21.9

## 2010-08-30 HISTORY — DX: Unspecified osteoarthritis, unspecified site: M19.90

## 2010-08-30 HISTORY — DX: Chronic obstructive pulmonary disease, unspecified: J44.9

## 2010-08-30 HISTORY — DX: Low back pain, unspecified: M54.50

## 2010-08-30 HISTORY — DX: Low back pain: M54.5

## 2010-08-30 MED ORDER — HYDROCODONE-ACETAMINOPHEN 7.5-325 MG PO TABS
1.0000 | ORAL_TABLET | Freq: Four times a day (QID) | ORAL | Status: AC | PRN
Start: 2010-08-30 — End: 2010-09-09

## 2010-08-30 MED ORDER — PREDNISONE 10 MG PO TABS: ORAL_TABLET | ORAL | Status: AC

## 2010-08-30 MED ORDER — CYCLOBENZAPRINE HCL 10 MG PO TABS: 10.0000 mg | ORAL_TABLET | Freq: Three times a day (TID) | ORAL | Status: AC | PRN

## 2010-08-30 NOTE — ED Provider Notes (Signed)
History     CSN: [id number] Arrival date & time: 08/30/2010 10:44 AM  Chief Complaint  Patient presents with  . Back Pain   Patient is a 35 y.o. female presenting with back pain. The history is provided by the patient.  Back Pain  This is a recurrent problem. The current episode started more than 2 days ago. The problem occurs constantly. The problem has been gradually worsening. The pain is associated with no known injury. The pain is present in the lumbar spine. The quality of the pain is described as stabbing, aching and burning. The pain radiates to the left thigh and right thigh. The pain is moderate. The symptoms are aggravated by bending and certain positions. The pain is the same all the time. Associated symptoms include tingling. Pertinent negatives include no chest pain, no fever, no numbness, no abdominal pain, no abdominal swelling, no bowel incontinence, no perianal numbness, no bladder incontinence, no dysuria, no pelvic pain, no paresis and no weakness. She has tried analgesics for the symptoms. The treatment provided no relief.    Past Medical History  Diagnosis Date  . Arthritis   . Low back pain   . COPD (chronic obstructive pulmonary disease)   . GERD (gastroesophageal reflux disease)     Past Surgical History  Procedure Date  . Tubal ligation     History reviewed. No pertinent family history.  History  Substance Use Topics  . Smoking status: Current Everyday Smoker    Types: Cigarettes  . Smokeless tobacco: Not on file  . Alcohol Use: No    OB History    Grav Para Term Preterm Abortions TAB SAB Ect Mult Living                  Review of Systems  Constitutional: Negative for fever and chills.  HENT: Negative for neck pain and neck stiffness.   Respiratory: Negative for cough, chest tightness and shortness of breath.   Cardiovascular: Negative.  Negative for chest pain.  Gastrointestinal: Negative for abdominal pain and bowel incontinence.    Genitourinary: Negative for bladder incontinence, dysuria, flank pain, decreased urine volume, vaginal bleeding, difficulty urinating, vaginal pain and pelvic pain.  Musculoskeletal: Positive for back pain.  Skin: Negative.   Neurological: Positive for tingling. Negative for dizziness, weakness and numbness.  Hematological: Does not bruise/bleed easily.  All other systems reviewed and are negative.    Physical Exam  BP 120/60  Pulse 84  Temp(Src) 98.3 F (36.8 C) (Oral)  Resp 16  Ht 5\' 8"  (1.727 m)  Wt 222 lb (100.699 kg)  BMI 33.76 kg/m2  SpO2 98%  LMP 08/26/2010  Physical Exam  Nursing note and vitals reviewed. Constitutional: She is oriented to person, place, and time. She appears well-developed and well-nourished. No distress.  HENT:  Head: Normocephalic and atraumatic.  Neck: Normal range of motion. Neck supple. No thyromegaly present.  Cardiovascular: Normal rate, regular rhythm and normal heart sounds.   Pulmonary/Chest: Effort normal and breath sounds normal.  Abdominal: Soft. There is no tenderness.  Musculoskeletal: She exhibits tenderness. She exhibits no edema.       Lumbar back: She exhibits tenderness. She exhibits normal range of motion, no swelling, no edema and normal pulse.  Lymphadenopathy:    She has no cervical adenopathy.  Neurological: She is alert and oriented to person, place, and time. She has normal reflexes. No cranial nerve deficit. She exhibits normal muscle tone. Coordination normal.  Skin: Skin is warm and dry.  ED Course  Procedures  MDM   Patient is ambulatory.  No focal neuro deficits.  ttp of the lumbar paraspinal muscles. DTR"s nml.    Hx of chronic low back pain.        Felipe Cabell L. [place], [place] 08/30/10 1118

## 2010-08-30 NOTE — ED Notes (Signed)
Lower back pain radiating into bil hips and buttock.  Also c/o neck pain radiating into right shoulder.  Denies new injury.  Hx of back pain.

## 2010-08-31 NOTE — ED Provider Notes (Signed)
Medical screening examination/treatment/procedure(s) were performed by non-physician practitioner and as supervising physician I was immediately available for consultation/collaboration.   Hanako Tipping L Abisai Deer, MD 08/31/10 0848 

## 2010-09-12 ENCOUNTER — Encounter: Payer: Self-pay | Admitting: Family Medicine

## 2010-09-12 ENCOUNTER — Ambulatory Visit (INDEPENDENT_AMBULATORY_CARE_PROVIDER_SITE_OTHER): Payer: Medicaid Other | Admitting: Family Medicine

## 2010-09-12 VITALS — BP 130/80 | HR 69 | Resp 16 | Ht 68.0 in | Wt 227.1 lb

## 2010-09-12 DIAGNOSIS — Z720 Tobacco use: Secondary | ICD-10-CM

## 2010-09-12 DIAGNOSIS — J4489 Other specified chronic obstructive pulmonary disease: Secondary | ICD-10-CM

## 2010-09-12 DIAGNOSIS — M545 Low back pain, unspecified: Secondary | ICD-10-CM

## 2010-09-12 DIAGNOSIS — J449 Chronic obstructive pulmonary disease, unspecified: Secondary | ICD-10-CM

## 2010-09-12 DIAGNOSIS — F17200 Nicotine dependence, unspecified, uncomplicated: Secondary | ICD-10-CM

## 2010-09-12 DIAGNOSIS — B070 Plantar wart: Secondary | ICD-10-CM

## 2010-09-12 DIAGNOSIS — F319 Bipolar disorder, unspecified: Secondary | ICD-10-CM

## 2010-09-12 DIAGNOSIS — J40 Bronchitis, not specified as acute or chronic: Secondary | ICD-10-CM

## 2010-09-12 MED ORDER — HYDROCODONE-ACETAMINOPHEN 7.5-650 MG PO TABS: 1.0000 | ORAL_TABLET | Freq: Four times a day (QID) | ORAL | Status: DC | PRN

## 2010-09-12 MED ORDER — PREDNISONE 10 MG PO TABS: ORAL_TABLET | ORAL | Status: DC

## 2010-09-12 MED ORDER — IPRATROPIUM BROMIDE 0.02 % IN SOLN
0.5000 mg | Freq: Once | RESPIRATORY_TRACT | Status: AC
  Administered 2010-09-12: 0.5 mg via RESPIRATORY_TRACT

## 2010-09-12 MED ORDER — DOXYCYCLINE HYCLATE 100 MG PO CAPS: 100.0000 mg | ORAL_CAPSULE | Freq: Two times a day (BID) | ORAL | Status: AC

## 2010-09-12 MED ORDER — ALBUTEROL SULFATE (2.5 MG/3ML) 0.083% IN NEBU
2.5000 mg | INHALATION_SOLUTION | Freq: Once | RESPIRATORY_TRACT | Status: AC
  Administered 2010-09-12: 2.5 mg via RESPIRATORY_TRACT

## 2010-09-12 MED ORDER — FLUTICASONE-SALMETEROL 250-50 MCG/DOSE IN AEPB: 1.0000 | INHALATION_SPRAY | Freq: Two times a day (BID) | RESPIRATORY_TRACT | Status: DC

## 2010-09-12 NOTE — Assessment & Plan Note (Signed)
The patient has history of bipolar disorder as well as depression and anxiety. I think she would be best served by seeing a psychiatrist as well as a Veterinary surgeon. I would not give her any anti-anxiety medication today with the other medication she is currently taking. She seems very overwhelmed by her situation.

## 2010-09-12 NOTE — Assessment & Plan Note (Signed)
MRI from  2006 showed mild facet degenerative disease otherwise no disc disease. She has complained of chronic back pain for many years. I have refilled her narcotic medication for short-term. I will followup with her medical history. She can continue Flexeril but uses mostly at bedtime secondary to side effect of drowsiness

## 2010-09-12 NOTE — Assessment & Plan Note (Signed)
Pt to start Compound W OTC

## 2010-09-12 NOTE — Patient Instructions (Signed)
Take the antibiotics as prescribed, take the steroids as prescribed I will get records from your last physician Use the pain medication only for severe pain Start the new inhaler to keep your from having problems with your lungs- Advair I advise you to quit smoking, this is a great time to do so! It is harmful to yourself and your family! Try Compound W as directed on the box for your right foot. Next visit in 4 weeks

## 2010-09-12 NOTE — Progress Notes (Signed)
Subjective:    Patient ID: Shannon Reese, female    DOB: 02-Aug-1975, 35 y.o.   MRN: [id number]  HPI  Previous PCP- Dr. Sherwood Gambler- [facility]   COPD- past week has had cough, both productive and non productive, has been using albuterol which has helped a little, used one of her mothers nebulizer, unsure if she has had any fever, +nauea, no vomiting, no sick contacts, + SOB, + wheezing, continues to smoke   Back pain- chronic back pain,  was seen at a pain clinic, Dr. Penelope Galas- Pain clinic in [place], she did not like the pain medication, she wanted to stay on Vicodin and they gave her [place] etc, was getting injections, has arthritis and nerve damage . Pain often radiates to both buttocks L >R, she has some tingling in her foot. Seen in ED on  8/22 for back pain- given refill on pain meds (this is where she has been getting them past few months), flexeril, steroid taper  Bipolar- history of bipolar and depression. She's not had any medication since her mother died approximately 2 years ago. She also describes panic attacks and inability to sleep. She has severe anxiety and is very stressed out. She is currently taking care of her grandmother as well as her children. She is not working at this time. She would like to be set with a psychiatrist again. She is asking for nerve pills today  Has a spot on the bottom of her foot- has been there for weeks, thought it was a callus but this did not come off, tried to file it down, still present, tender to touch, no drainage from lesion  Pt late for visit today  Review of Systems see above    GEN- + fatigue, denies fever, weight loss,weakness, +recent illness HEENT- denies eye drainage, change in vision, nasal discharge, CVS- denies chest pain, occ palpitations RESP- + SOB, cough, wheeze ABD- denies change in stools, abd pain GU- denies , incontinence MSK- + joint pain, muscle aches, denies injury       Objective:   Physical Exam  GEN- NAD,  alert and oriented x3, sick appearing  HEENT- PERRL, EOMI, non injected sclera, pink conjunctiva, MMM, oropharynx clear Neck- Supple, no thryomegaly CVS- RRR, no murmur RESP-bilat expiratory wheeze, increased end expiratory time, no rhonchi, +cough, fair air movement,  increased WOB during coughing fit s/p neb, good air movement, decreased wheeze EXT- No edema, foot- callus region on sole of foot, black dot center, TTP Back- TTP lumbar spine, Neg SLR, +discomoft in back with IR/ER, flexion Pulses- Radial, DP- 2+ Psych- not depressed appearing,not anxious appearing, normal affect, ill appearing today, no apparent hallucinations, no apparent SI       Assessment & Plan:

## 2010-09-12 NOTE — Assessment & Plan Note (Signed)
Chest x-ray from 2010 showed chronic bronchitic changes as well as suspicion for COPD. I will treat her with a course of doxycycline, short-term steroid burst and start her on Advair for continued symptoms. She will need PFTs done in the future

## 2010-09-12 NOTE — Assessment & Plan Note (Signed)
Encouraged to quit, pt contemplating quitting tobacco

## 2010-09-25 ENCOUNTER — Other Ambulatory Visit: Payer: Self-pay | Admitting: Family Medicine

## 2010-09-28 ENCOUNTER — Telehealth: Payer: Self-pay | Admitting: Family Medicine

## 2010-09-28 DIAGNOSIS — M549 Dorsalgia, unspecified: Secondary | ICD-10-CM

## 2010-09-28 DIAGNOSIS — G8929 Other chronic pain: Secondary | ICD-10-CM

## 2010-09-28 NOTE — Telephone Encounter (Signed)
Patient states she is out of her pain medication, states she is going out of town with her grandmother. Will you refill her pain med? States she sees you again in two weeks.

## 2010-09-28 NOTE — Telephone Encounter (Signed)
Her pain medication was for short term only, I will not refill her pain medication at this time. If she likes I can refer to a new pain management clinic.

## 2010-09-29 NOTE — Telephone Encounter (Signed)
Patient would like referral to pain management clinic. 

## 2010-10-02 NOTE — Telephone Encounter (Signed)
Referral sent 

## 2010-10-05 ENCOUNTER — Emergency Department (HOSPITAL_COMMUNITY)
Admission: EM | Admit: 2010-10-05 | Discharge: 2010-10-05 | Disposition: A | Discharge status: Home / Self Care | Payer: Medicaid Other | Attending: Emergency Medicine | Admitting: Emergency Medicine

## 2010-10-05 ENCOUNTER — Encounter (HOSPITAL_COMMUNITY): Payer: Self-pay | Admitting: *Deleted

## 2010-10-05 DIAGNOSIS — Z79899 Other long term (current) drug therapy: Secondary | ICD-10-CM | POA: Insufficient documentation

## 2010-10-05 DIAGNOSIS — K029 Dental caries, unspecified: Secondary | ICD-10-CM | POA: Insufficient documentation

## 2010-10-05 DIAGNOSIS — K0889 Other specified disorders of teeth and supporting structures: Secondary | ICD-10-CM

## 2010-10-05 DIAGNOSIS — F17200 Nicotine dependence, unspecified, uncomplicated: Secondary | ICD-10-CM | POA: Insufficient documentation

## 2010-10-05 DIAGNOSIS — K089 Disorder of teeth and supporting structures, unspecified: Secondary | ICD-10-CM | POA: Insufficient documentation

## 2010-10-05 MED ORDER — CLINDAMYCIN HCL 150 MG PO CAPS
300.0000 mg | ORAL_CAPSULE | Freq: Once | ORAL | Status: AC
  Administered 2010-10-05: 300 mg via ORAL
  Filled 2010-10-05: qty 2

## 2010-10-05 MED ORDER — TRAMADOL HCL 50 MG PO TABS
ORAL_TABLET | ORAL | Status: DC
Start: 2010-10-05 — End: 2010-10-09

## 2010-10-05 MED ORDER — OXYCODONE-ACETAMINOPHEN 5-325 MG PO TABS
1.0000 | ORAL_TABLET | Freq: Once | ORAL | Status: AC
  Administered 2010-10-05: 1 via ORAL
  Filled 2010-10-05: qty 1

## 2010-10-05 MED ORDER — CLINDAMYCIN HCL 300 MG PO CAPS: ORAL_CAPSULE | ORAL | Status: DC

## 2010-10-05 NOTE — ED Provider Notes (Signed)
History     CSN: [id number] Arrival date & time: 10/05/2010 10:03 AM  Chief Complaint  Patient presents with  . Dental Pain    (Consider location/radiation/quality/duration/timing/severity/associated sxs/prior treatment) Patient is a 35 y.o. female presenting with tooth pain. The history is provided by the patient.  Dental PainThe primary symptoms include mouth pain. Primary symptoms do not include oral bleeding, oral lesions, headaches, fever, shortness of breath, sore throat or angioedema. The symptoms began 3 to 5 days ago. The symptoms are unchanged. The symptoms are new. The symptoms occur constantly.  Affected locations include: teeth.  Additional symptoms include: dental sensitivity to temperature, gum swelling, gum tenderness, purulent gums and trismus. Additional symptoms do not include: trouble swallowing, ear pain, nosebleeds and swollen glands. Medical issues include: smoking and periodontal disease.    Past Medical History  Diagnosis Date  . Arthritis   . Low back pain   . COPD (chronic obstructive pulmonary disease)   . GERD (gastroesophageal reflux disease)   . Bipolar disorder   . Anxiety     Past Surgical History  Procedure Date  . Tubal ligation     Family History  Problem Relation Age of Onset  . Depression Mother   . Arthritis Mother   . Heart disease Father   . Diabetes Father   . COPD Father   . COPD Sister   . Arthritis Sister   . Alcohol abuse Sister   . Hypertension Brother   . Diabetes Brother     History  Substance Use Topics  . Smoking status: Current Everyday Smoker -- 0.5 packs/day    Types: Cigarettes  . Smokeless tobacco: Not on file  . Alcohol Use: No    OB History    Grav Para Term Preterm Abortions TAB SAB Ect Mult Living                  Review of Systems  Constitutional: Negative for fever.  HENT: Positive for dental problem. Negative for ear pain, nosebleeds, sore throat, trouble swallowing, neck pain and neck  stiffness.   Respiratory: Negative.  Negative for shortness of breath.   Skin: Negative.   Neurological: Negative for dizziness, weakness, numbness and headaches.  Hematological: Negative for adenopathy.  All other systems reviewed and are negative.    Allergies  Cephalexin; Ketorolac tromethamine; Lansoprazole; and Penicillins  Home Medications   Current Outpatient Rx  Name Route Sig Dispense Refill  . ALBUTEROL 90 MCG/ACT IN AERS Inhalation Inhale 2 puffs into the lungs every 4 (four) hours as needed. For shortness of breath    . ASPIRIN-ACETAMINOPHEN 500-325 MG PO PACK Oral Take 1 packet by mouth daily as needed. For pain     . CYCLOBENZAPRINE HCL 10 MG PO TABS Oral Take 10 mg by mouth 3 (three) times daily as needed. Muscle spasms    . FLUTICASONE-SALMETEROL 250-50 MCG/DOSE IN AEPB Inhalation Inhale 1 puff into the lungs 2 (two) times daily. 1 each 3  . OMEPRAZOLE 20 MG PO CPDR Oral Take 20 mg by mouth 2 (two) times daily.      Marland Kitchen HYDROCODONE-ACETAMINOPHEN 7.5-650 MG PO TABS Oral Take 1 tablet by mouth every 6 (six) hours as needed. 45 tablet 0  . PREDNISONE 10 MG PO TABS  Take 40mg  by mouth daily x 5 days 20 tablet 0    BP 129/77  Pulse 73  Temp(Src) 98.3 F (36.8 C) (Oral)  Resp 14  Ht 5\' 8"  (1.727 m)  Wt 222 lb (100.699  kg)  BMI 33.76 kg/m2  SpO2 100%  LMP 09/12/2010  Physical Exam  Nursing note and vitals reviewed. Constitutional: She is oriented to person, place, and time. She appears well-developed and well-nourished. No distress.  HENT:  Head: Normocephalic and atraumatic. No trismus in the jaw.  Mouth/Throat: Uvula is midline, oropharynx is clear and moist and mucous membranes are normal. Dental caries present. No dental abscesses or uvula swelling. No oropharyngeal exudate, posterior oropharyngeal edema, posterior oropharyngeal erythema or tonsillar abscesses.    Eyes: EOM are normal. Pupils are equal, round, and reactive to light.  Neck: Normal range of  motion. Neck supple.  Cardiovascular: Normal rate, regular rhythm and normal heart sounds.   Pulmonary/Chest: Effort normal and breath sounds normal.  Lymphadenopathy:    She has no cervical adenopathy.  Neurological: She is alert and oriented to person, place, and time.  Skin: Skin is warm and dry.    ED Course  Procedures (including critical care time)       MDM    11:02 AM I will start pt on abx and she agrees to close f/u with a dentist.        Merilynn Haydu L. [place], [place] 10/08/10 2257

## 2010-10-05 NOTE — ED Notes (Signed)
Pt c/o left upper and lower dental pain x 4 days;

## 2010-10-09 ENCOUNTER — Ambulatory Visit (INDEPENDENT_AMBULATORY_CARE_PROVIDER_SITE_OTHER): Payer: Medicaid Other | Admitting: Family Medicine

## 2010-10-09 ENCOUNTER — Encounter: Payer: Self-pay | Admitting: Family Medicine

## 2010-10-09 VITALS — BP 102/70 | HR 85 | Resp 16 | Wt 231.1 lb

## 2010-10-09 DIAGNOSIS — M545 Low back pain, unspecified: Secondary | ICD-10-CM

## 2010-10-09 DIAGNOSIS — F32A Depression, unspecified: Secondary | ICD-10-CM

## 2010-10-09 DIAGNOSIS — J449 Chronic obstructive pulmonary disease, unspecified: Secondary | ICD-10-CM

## 2010-10-09 DIAGNOSIS — F329 Major depressive disorder, single episode, unspecified: Secondary | ICD-10-CM

## 2010-10-09 MED ORDER — OMEPRAZOLE 20 MG PO CPDR: 20.0000 mg | DELAYED_RELEASE_CAPSULE | Freq: Two times a day (BID) | ORAL | Status: DC

## 2010-10-09 MED ORDER — ESCITALOPRAM OXALATE 10 MG PO TABS: 10.0000 mg | ORAL_TABLET | Freq: Every day | ORAL | Status: DC

## 2010-10-09 MED ORDER — CYCLOBENZAPRINE HCL 10 MG PO TABS: 10.0000 mg | ORAL_TABLET | Freq: Every evening | ORAL | Status: DC | PRN

## 2010-10-09 MED ORDER — CLONAZEPAM 1 MG PO TABS: 1.0000 mg | ORAL_TABLET | Freq: Two times a day (BID) | ORAL | Status: DC | PRN

## 2010-10-09 NOTE — Progress Notes (Signed)
Subjective:    Patient ID: Shannon Reese, female    DOB: 1975/10/20, 35 y.o.   MRN: [id number]  HPI COPD- taking advair, has pain in center of back , she has this on and off is not sure if this is secondary to her lung problem. She denies any fever, cough, shortness of breath. She has rarely used her albuterol since starting the Advair. She continues to smoke daily.  Depression- patient has a pretty significant history of bipolar, depression, anxiety. Has a  history of using  zoloft, prozac, wellbutrin, paxil, effexor, has been on klonopin and xanax in the past as well. She states she is very overwhelmed and this is contributing to her pain. 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 She feels guilty because her mother was sick and alignment she died in her grandmother help raise her therefore she needs to return the favor. She also has 2 young children which can be difficult as the of both diagnosed with ADHD. She denies any suicidal ideations but feels very overwhelmed. She's not sleeping well is not eating as she should and has been gaining weight. She gave me multiple reasons for stopping many of her antidepressants and anxiolytics in the past. Most made her sick or she not feel a difference therefore stop taking them. She did like the effects of Xanax.   Chronic pain- I discussed with patient I reviewed her previous medical records with her previous PCP her back pain was not a significant on imaging and exam as she had described. Therefore she ended up going to the pain clinic for other modalities. She did complain they were giving her too strong of the medication and all she wanted with Vicodin. She is asking for a refill on Flexeril as she knows I will not prescribe narcotics.She has also been using [employer]  Review of Systems   GEN- +fatigue,denies  fever, weight loss,weakness, CVS- denies chest pain, occ  palpitations RESP- denies SOB, cough, wheeze ABD- denies N/V,  abd pain, +constipation  MSK-+ joint pain, + muscle aches,no  injury Psych- no hallucinations, no SI       Objective:   Physical Exam GEN- NAD, in pajamas today (rushing to appt), alert and oriented CVS-RRR, no murmur RESP- CTAB, normal WOB, no wheeze, no rhonchi Ext- no edema Psych- normal affect, appears fatigued and depressed, not overly anxious, no apparent hallucinations, normal speech       Assessment & Plan:

## 2010-10-09 NOTE — Assessment & Plan Note (Addendum)
She has chronic pain. I reviewed her previous images and her PCP notes. In she is addicted to pain medication when she does have this because of her depression. I do not think that short acting narcotics are good for her. She's been sent to pain clinic as requested for other modalities. She states at this time she would like to get off of medications they can also help her with this Flexeril refills patient noted not to take if drowsy from Klonopin Also advised not to take any patterns she already has GERD

## 2010-10-09 NOTE — Patient Instructions (Signed)
Continue your inhalers- continue the advair and use albuterol as needed Start the lexapro daily Use the Klonopin twice a day as needed I have refilled the flexeril  I will set up a referral for counseling Next visit in 2 weeks f/u Mood

## 2010-10-09 NOTE — Assessment & Plan Note (Signed)
Patient has significant mood disorder. She has history of bipolar but not on any meds for some time. She has been doing many different antidepressants and anxiolytics. I'm not sure which antipsychotic she has been on. I will refer her to come behavioral health. Today we will start low dose Lexapro and I will give her intermediate acting Klonopin twice daily for anxiety. She will followup in 2 weeks .

## 2010-10-09 NOTE — Assessment & Plan Note (Signed)
Improved with Advair status post treatment for it daily exacerbation a few weeks ago.

## 2010-10-11 ENCOUNTER — Telehealth: Payer: Self-pay | Admitting: Family Medicine

## 2010-10-11 MED ORDER — TRAMADOL HCL 50 MG PO TABS: 50.0000 mg | ORAL_TABLET | Freq: Three times a day (TID) | ORAL | Status: DC | PRN

## 2010-10-11 NOTE — Telephone Encounter (Signed)
I have discussed this with her, I will not prescribe narcotics I have sent Ultram, she can use this as needed. She will have to wait for her pain clinic.

## 2010-10-11 NOTE — Telephone Encounter (Signed)
I know you are not going to prescribe a narc for this patient. What do you want me to tell her??

## 2010-10-12 ENCOUNTER — Telehealth: Payer: Self-pay | Admitting: Family Medicine

## 2010-10-12 NOTE — Telephone Encounter (Signed)
I told patient if she couldn't take the ultram then she would have to wait until the pain clinic appt. She said she would try the med.

## 2010-10-12 NOTE — Telephone Encounter (Signed)
Nothing else will be sent. Patient aware

## 2010-10-13 NOTE — ED Provider Notes (Signed)
Medical screening examination/treatment/procedure(s) were performed by non-physician practitioner and as supervising physician I was immediately available for consultation/collaboration.   Forbes Cellar, MD 10/13/10 [phone]

## 2010-10-23 ENCOUNTER — Ambulatory Visit (INDEPENDENT_AMBULATORY_CARE_PROVIDER_SITE_OTHER): Payer: Medicaid Other | Admitting: Family Medicine

## 2010-10-23 ENCOUNTER — Encounter: Payer: Self-pay | Admitting: Family Medicine

## 2010-10-23 VITALS — BP 108/88 | HR 80 | Resp 16 | Wt 232.0 lb

## 2010-10-23 DIAGNOSIS — M545 Low back pain, unspecified: Secondary | ICD-10-CM

## 2010-10-23 DIAGNOSIS — G8929 Other chronic pain: Secondary | ICD-10-CM

## 2010-10-23 DIAGNOSIS — F32A Depression, unspecified: Secondary | ICD-10-CM

## 2010-10-23 DIAGNOSIS — M542 Cervicalgia: Secondary | ICD-10-CM

## 2010-10-23 DIAGNOSIS — F3289 Other specified depressive episodes: Secondary | ICD-10-CM

## 2010-10-23 DIAGNOSIS — F329 Major depressive disorder, single episode, unspecified: Secondary | ICD-10-CM

## 2010-10-23 NOTE — Progress Notes (Signed)
Subjective:    Patient ID: Shannon Reese, female    DOB: 12/28/1975, 35 y.o.   MRN: [id number]  HPI   Psych appt- this month  Lexapro caused lots of mood swings, klonopin helps, she stopped Lexapro last Friday, no change in overall mood   ROS- no SI, no hallucinations   Back pain- mostly in thoracic region, upset I will not prescribe her narcotics, she does not want to be given long acting "strong meds" she wants to use short acting meds only . She feels her back pain has worsened since her last MRI, she has neck, thoracic and worse low back pain.   ROS- no change in bowel or bladder  Review of Systems  - per above     Objective:   Physical Exam GEN-NAD, alert and oriented PSYCH- ppears fatigued and depressed, not overly anxious, no apparent hallucinations, normal speech Neck- normal ROM, but pt states painful and stiff Back- TTP thoracic spine and lumbar spine, no spasm noted, neg SLR      Assessment & Plan:

## 2010-10-23 NOTE — Patient Instructions (Signed)
For your back I will get images, I will also get a new x-ray of your neck Continue your current meds Stop the lexapro F/u with Psych as scheduled and the pain clinic We will call with results of your x-rays Next visit 4 months

## 2010-10-24 NOTE — Assessment & Plan Note (Signed)
Pt will see psych for further meds, she has stopped her daily med, continue with prn klonopin until seen by them

## 2010-10-24 NOTE — Assessment & Plan Note (Signed)
I discussed with pt again I will not prescribe narcotics, she feels ultram does not work. She is upset we are using old imaging for her diagnosis. Note she did stop going to pain clinic on her own. Will obtain MRI- it is reasonable to get updated scan with increased pain.

## 2010-10-30 ENCOUNTER — Other Ambulatory Visit: Payer: Self-pay | Admitting: Family Medicine

## 2010-10-30 NOTE — Telephone Encounter (Signed)
Too early for refill  

## 2010-11-01 ENCOUNTER — Other Ambulatory Visit: Payer: Self-pay | Admitting: Family Medicine

## 2010-11-02 ENCOUNTER — Telehealth: Payer: Self-pay | Admitting: Family Medicine

## 2010-11-02 MED ORDER — CLONAZEPAM 1 MG PO TABS: 1.0000 mg | ORAL_TABLET | Freq: Two times a day (BID) | ORAL | Status: DC | PRN

## 2010-11-02 NOTE — Telephone Encounter (Signed)
She called and [facility] and talked with [place]. She admits she took a few extra klonopin than she was supposed to because she was having such a rough time. She is so nervous now and she is out of her medicine and she states she is working really had to get her nerves straightened out and she was begging to please let her fill them a few days early. (called pharmacy) She had them filled last on 10/09/2010.

## 2010-11-02 NOTE — Telephone Encounter (Signed)
Please let her know she must take as prescribed. I will allow an early refill this one time. She needs to follow up with psychiatry as we discussed. I have called it in.

## 2010-11-03 ENCOUNTER — Telehealth: Payer: Self-pay | Admitting: Family Medicine

## 2010-11-03 NOTE — Telephone Encounter (Signed)
I spoke with [employer]. Given okay to fill Klonopin

## 2010-11-03 NOTE — Telephone Encounter (Signed)
Patient aware.

## 2010-11-07 ENCOUNTER — Emergency Department (HOSPITAL_COMMUNITY)
Admission: EM | Admit: 2010-11-07 | Discharge: 2010-11-07 | Disposition: A | Discharge status: Home / Self Care | Payer: Medicaid Other | Attending: Emergency Medicine | Admitting: Emergency Medicine

## 2010-11-07 ENCOUNTER — Encounter (HOSPITAL_COMMUNITY): Payer: Self-pay | Admitting: *Deleted

## 2010-11-07 ENCOUNTER — Emergency Department (HOSPITAL_COMMUNITY): Payer: Medicaid Other

## 2010-11-07 DIAGNOSIS — J4489 Other specified chronic obstructive pulmonary disease: Secondary | ICD-10-CM | POA: Insufficient documentation

## 2010-11-07 DIAGNOSIS — Z7982 Long term (current) use of aspirin: Secondary | ICD-10-CM | POA: Insufficient documentation

## 2010-11-07 DIAGNOSIS — M546 Pain in thoracic spine: Secondary | ICD-10-CM

## 2010-11-07 DIAGNOSIS — J449 Chronic obstructive pulmonary disease, unspecified: Secondary | ICD-10-CM | POA: Insufficient documentation

## 2010-11-07 MED ORDER — OXYCODONE-ACETAMINOPHEN 5-325 MG PO TABS
1.0000 | ORAL_TABLET | Freq: Once | ORAL | Status: AC
  Administered 2010-11-07: 1 via ORAL
  Filled 2010-11-07: qty 1

## 2010-11-07 NOTE — ED Notes (Signed)
Pt states she has taken flexeril, toradol, and tramadol at home w/o relief.

## 2010-11-07 NOTE — ED Notes (Signed)
D/c instructions reviewed w/ pt - pt denies any further questions or concerns at present.   

## 2010-11-07 NOTE — ED Notes (Signed)
Pt reports rt flank pain x3 days - pt denies any mechanism of injury, denies urinary problems, or any other associating symptoms. Pt w/ hx of degenerative disc disease however states this pain is different. Pt states pain is worse w/ movement or palpation.

## 2010-11-07 NOTE — ED Notes (Signed)
Pain lt upper back , hurts to take deep breath and to move.  Tender to palp

## 2010-11-07 NOTE — ED Provider Notes (Signed)
History     CSN: [id number] Arrival date & time: 11/07/2010  3:54 AM   First MD Initiated Contact with Patient 11/07/10 [phone]      Chief Complaint  Patient presents with  . Back Pain     The history is provided by the patient.   patient reports approximately 24 hours of left upper thoracic pain worsened by movement of her left arm.  She denies recent trauma.  She denies weakness of her left upper extremity.  She reports it hurts more when she moves and takes a deep breath.  She denies recent travel or surgery.  She denies unilateral leg swelling.  She denies history of DVT or PE.  She denies chest pain and shortness of breath.  Denies fevers or chills cough or congestion.  Denies any recent illness.  She reports she does have a history of chronic low back pain secondary to herniated disc for which is on tramadol and baby powders at home.  She reports this is not helping for her pain.  The pain is moderate to severe.  Nothing seems to improve her pain.  Past Medical History  Diagnosis Date  . Arthritis   . Low back pain   . COPD (chronic obstructive pulmonary disease)   . GERD (gastroesophageal reflux disease)   . Bipolar disorder   . Anxiety     Past Surgical History  Procedure Date  . Tubal ligation     Family History  Problem Relation Age of Onset  . Depression Mother   . Arthritis Mother   . Heart disease Father   . Diabetes Father   . COPD Father   . COPD Sister   . Arthritis Sister   . Alcohol abuse Sister   . Hypertension Brother   . Diabetes Brother     History  Substance Use Topics  . Smoking status: Current Everyday Smoker -- 0.5 packs/day    Types: Cigarettes  . Smokeless tobacco: Not on file  . Alcohol Use: No    OB History    Grav Para Term Preterm Abortions TAB SAB Ect Mult Living                  Review of Systems  Musculoskeletal: Positive for back pain.  All other systems reviewed and are negative.    Allergies  Cephalexin; Ketorolac  tromethamine; Lansoprazole; and Penicillins  Home Medications   Current Outpatient Rx  Name Route Sig Dispense Refill  . ALBUTEROL 90 MCG/ACT IN AERS Inhalation Inhale 2 puffs into the lungs every 4 (four) hours as needed. For shortness of breath    . ASPIRIN-ACETAMINOPHEN 500-325 MG PO PACK Oral Take 1 packet by mouth daily as needed. For pain     . CLONAZEPAM 1 MG PO TABS Oral Take 1 tablet (1 mg total) by mouth 2 (two) times daily as needed for anxiety. 60 tablet 1  . CYCLOBENZAPRINE HCL 10 MG PO TABS Oral Take 1 tablet (10 mg total) by mouth at bedtime as needed for muscle spasms. 30 tablet 1  . FLUTICASONE-SALMETEROL 250-50 MCG/DOSE IN AEPB Inhalation Inhale 1 puff into the lungs 2 (two) times daily. 1 each 3  . OMEPRAZOLE 20 MG PO CPDR Oral Take 1 capsule (20 mg total) by mouth 2 (two) times daily. 60 capsule 3  . TRAMADOL HCL 50 MG PO TABS Oral Take 1 tablet (50 mg total) by mouth 3 (three) times daily as needed for pain. 40 tablet 1  . ESCITALOPRAM OXALATE  10 MG PO TABS Oral Take 1 tablet (10 mg total) by mouth daily. 30 tablet 2  . HYDROCODONE-ACETAMINOPHEN 10-325 MG PO TABS Oral Take 1 tablet by mouth every 6 (six) hours as needed.        BP 115/56  Pulse 86  Temp(Src) 98.3 F (36.8 C) (Oral)  Resp 20  Ht 5\' 8"  (1.727 m)  Wt 222 lb (100.699 kg)  BMI 33.76 kg/m2  SpO2 99%  LMP 10/21/2010  Physical Exam  Nursing note and vitals reviewed. Constitutional: She is oriented to person, place, and time. She appears well-developed and well-nourished. No distress.  HENT:  Head: Normocephalic and atraumatic.  Eyes: EOM are normal.  Neck: Normal range of motion.  Cardiovascular: Normal rate, regular rhythm and normal heart sounds.   Pulmonary/Chest: Effort normal and breath sounds normal.  Abdominal: Soft. She exhibits no distension. There is no tenderness.  Musculoskeletal: Normal range of motion.       Patient with mild tenderness on examination of her left posterior thoracic  cavity.  She has no rash or erythema.  She has no midline cervical thoracic or lumbar tenderness.  There is no palpable muscles spasms noted  Neurological: She is alert and oriented to person, place, and time.  Skin: Skin is warm and dry.  Psychiatric: She has a normal mood and affect. Judgment normal.    ED Course  Procedures (including critical care time)  Labs Reviewed - No data to display No results found.   1. Thoracic back pain    I personally reviewed the patient's x-ray   MDM  Patient likely thoracic strain.  Chest x-ray demonstrates no evidence of acute cardiopulmonary pathology.  She has normal upper and lower strain neurologic exam.  She has tramadol and additional pain medicine at home.  She's been encouraged to follow up with her primary care doctor in one to 2 days if not improved and return to the ER for any new or worsening symptoms       Lyanne Co, MD 11/07/10 [phone]

## 2010-12-04 ENCOUNTER — Encounter (HOSPITAL_COMMUNITY): Payer: Self-pay | Admitting: *Deleted

## 2010-12-04 ENCOUNTER — Emergency Department (HOSPITAL_COMMUNITY)
Admission: EM | Admit: 2010-12-04 | Discharge: 2010-12-04 | Disposition: A | Discharge status: Home / Self Care | Payer: Medicaid Other | Attending: Emergency Medicine | Admitting: Emergency Medicine

## 2010-12-04 DIAGNOSIS — K089 Disorder of teeth and supporting structures, unspecified: Secondary | ICD-10-CM | POA: Insufficient documentation

## 2010-12-04 DIAGNOSIS — K0889 Other specified disorders of teeth and supporting structures: Secondary | ICD-10-CM

## 2010-12-04 MED ORDER — HYDROCODONE-ACETAMINOPHEN 7.5-500 MG PO TABS: ORAL_TABLET | ORAL | Status: DC

## 2010-12-04 MED ORDER — HYDROCODONE-ACETAMINOPHEN 5-325 MG PO TABS
2.0000 | ORAL_TABLET | Freq: Once | ORAL | Status: AC
  Administered 2010-12-04: 2 via ORAL
  Filled 2010-12-04: qty 2

## 2010-12-04 MED ORDER — CLINDAMYCIN HCL 150 MG PO CAPS: 300.0000 mg | ORAL_CAPSULE | Freq: Three times a day (TID) | ORAL | Status: AC

## 2010-12-04 MED ORDER — CLINDAMYCIN HCL 150 MG PO CAPS
300.0000 mg | ORAL_CAPSULE | Freq: Once | ORAL | Status: AC
  Administered 2010-12-04: 300 mg via ORAL
  Filled 2010-12-04: qty 2

## 2010-12-04 NOTE — ED Provider Notes (Signed)
Medical screening examination/treatment/procedure(s) were performed by non-physician practitioner and as supervising physician I was immediately available for consultation/collaboration.  Lundy Cozart, MD 12/04/10 2059 

## 2010-12-04 NOTE — ED Notes (Signed)
Pt c/o toothache right and left lower teeth and headache x 3 weeks.

## 2010-12-04 NOTE — ED Provider Notes (Signed)
History     CSN: [id number] Arrival date & time: 12/04/2010 12:02 PM   First MD Initiated Contact with Patient 12/04/10 1308      Chief Complaint  Patient presents with  . Dental Pain    (Consider location/radiation/quality/duration/timing/severity/associated sxs/prior treatment) HPI Comments: Pt has been to see her dentist and had xrays.  She was referred to an oral surgeon.  She had to call and cancel the consultation b/c of a conflict and was told they were no longer willing to see her.  She is going back to her dentist for another referral to someone else.  Patient is a 35 y.o. female presenting with tooth pain. The history is provided by the patient. No language interpreter was used.  Dental PainThe primary symptoms include mouth pain and headaches. Primary symptoms do not include dental injury, oral bleeding or fever.    Past Medical History  Diagnosis Date  . Arthritis   . Low back pain   . COPD (chronic obstructive pulmonary disease)   . GERD (gastroesophageal reflux disease)   . Bipolar disorder   . Anxiety     Past Surgical History  Procedure Date  . Tubal ligation     Family History  Problem Relation Age of Onset  . Depression Mother   . Arthritis Mother   . Heart disease Father   . Diabetes Father   . COPD Father   . COPD Sister   . Arthritis Sister   . Alcohol abuse Sister   . Hypertension Brother   . Diabetes Brother     History  Substance Use Topics  . Smoking status: Current Everyday Smoker -- 0.5 packs/day    Types: Cigarettes  . Smokeless tobacco: Not on file  . Alcohol Use: No    OB History    Grav Para Term Preterm Abortions TAB SAB Ect Mult Living                  Review of Systems  Constitutional: Negative for fever.  HENT: Positive for dental problem.   Neurological: Positive for headaches.  All other systems reviewed and are negative.    Allergies  Cephalexin; Ketorolac tromethamine; Lansoprazole; and Penicillins  Home  Medications   Current Outpatient Rx  Name Route Sig Dispense Refill  . ALBUTEROL 90 MCG/ACT IN AERS Inhalation Inhale 2 puffs into the lungs every 4 (four) hours as needed. For shortness of breath    . ASPIRIN-ACETAMINOPHEN 500-325 MG PO PACK Oral Take 1 packet by mouth daily as needed. For pain     . CLONAZEPAM 1 MG PO TABS Oral Take 1 tablet (1 mg total) by mouth 2 (two) times daily as needed for anxiety. 60 tablet 1  . CYCLOBENZAPRINE HCL 10 MG PO TABS Oral Take 1 tablet (10 mg total) by mouth at bedtime as needed for muscle spasms. 30 tablet 1  . ESCITALOPRAM OXALATE 10 MG PO TABS Oral Take 1 tablet (10 mg total) by mouth daily. 30 tablet 2  . FLUTICASONE-SALMETEROL 250-50 MCG/DOSE IN AEPB Inhalation Inhale 1 puff into the lungs 2 (two) times daily. 1 each 3  . HYDROCODONE-ACETAMINOPHEN 10-325 MG PO TABS Oral Take 1 tablet by mouth every 6 (six) hours as needed.      Marland Kitchen OMEPRAZOLE 20 MG PO CPDR Oral Take 1 capsule (20 mg total) by mouth 2 (two) times daily. 60 capsule 3  . TRAMADOL HCL 50 MG PO TABS Oral Take 1 tablet (50 mg total) by mouth 3 (three)  times daily as needed for pain. 40 tablet 1    BP 142/82  Pulse 75  Temp(Src) 98.5 F (36.9 C) (Oral)  Resp 16  Ht 5\' 8"  (1.727 m)  Wt 223 lb (101.152 kg)  BMI 33.91 kg/m2  SpO2 100%  LMP 10/21/2010  Physical Exam  Nursing note and vitals reviewed. Constitutional: She is oriented to person, place, and time. She appears well-developed and well-nourished. No distress.  HENT:  Head: Normocephalic and atraumatic. No trismus in the jaw.  Mouth/Throat: Oropharynx is clear and moist and mucous membranes are normal. Abnormal dentition. Dental caries present. No uvula swelling.    Eyes: EOM are normal.  Neck: Normal range of motion.  Cardiovascular: Normal rate, regular rhythm and normal heart sounds.   Pulmonary/Chest: Effort normal and breath sounds normal.  Abdominal: Soft. She exhibits no distension. There is no tenderness.    Musculoskeletal: Normal range of motion.  Neurological: She is alert and oriented to person, place, and time.  Skin: Skin is warm and dry.  Psychiatric: She has a normal mood and affect. Judgment normal.    ED Course  Procedures (including critical care time)  Labs Reviewed - No data to display No results found.   No diagnosis found.    MDM          Worthy Rancher, PA 12/04/10 [phone]

## 2010-12-15 ENCOUNTER — Other Ambulatory Visit: Payer: Self-pay

## 2010-12-15 MED ORDER — OMEPRAZOLE 20 MG PO CPDR: 20.0000 mg | DELAYED_RELEASE_CAPSULE | Freq: Two times a day (BID) | ORAL | Status: DC

## 2011-01-03 ENCOUNTER — Emergency Department (HOSPITAL_COMMUNITY)
Admission: EM | Admit: 2011-01-03 | Discharge: 2011-01-03 | Disposition: A | Discharge status: Home / Self Care | Payer: Medicaid Other | Attending: Emergency Medicine | Admitting: Emergency Medicine

## 2011-01-03 ENCOUNTER — Encounter (HOSPITAL_COMMUNITY): Payer: Self-pay | Admitting: Emergency Medicine

## 2011-01-03 DIAGNOSIS — J4489 Other specified chronic obstructive pulmonary disease: Secondary | ICD-10-CM | POA: Insufficient documentation

## 2011-01-03 DIAGNOSIS — Z79899 Other long term (current) drug therapy: Secondary | ICD-10-CM | POA: Insufficient documentation

## 2011-01-03 DIAGNOSIS — R220 Localized swelling, mass and lump, head: Secondary | ICD-10-CM | POA: Insufficient documentation

## 2011-01-03 DIAGNOSIS — R51 Headache: Secondary | ICD-10-CM | POA: Insufficient documentation

## 2011-01-03 DIAGNOSIS — K219 Gastro-esophageal reflux disease without esophagitis: Secondary | ICD-10-CM | POA: Insufficient documentation

## 2011-01-03 DIAGNOSIS — K089 Disorder of teeth and supporting structures, unspecified: Secondary | ICD-10-CM | POA: Insufficient documentation

## 2011-01-03 DIAGNOSIS — F17200 Nicotine dependence, unspecified, uncomplicated: Secondary | ICD-10-CM | POA: Insufficient documentation

## 2011-01-03 DIAGNOSIS — K029 Dental caries, unspecified: Secondary | ICD-10-CM | POA: Insufficient documentation

## 2011-01-03 DIAGNOSIS — F411 Generalized anxiety disorder: Secondary | ICD-10-CM | POA: Insufficient documentation

## 2011-01-03 DIAGNOSIS — J449 Chronic obstructive pulmonary disease, unspecified: Secondary | ICD-10-CM | POA: Insufficient documentation

## 2011-01-03 DIAGNOSIS — F319 Bipolar disorder, unspecified: Secondary | ICD-10-CM | POA: Insufficient documentation

## 2011-01-03 DIAGNOSIS — K0889 Other specified disorders of teeth and supporting structures: Secondary | ICD-10-CM

## 2011-01-03 DIAGNOSIS — M129 Arthropathy, unspecified: Secondary | ICD-10-CM | POA: Insufficient documentation

## 2011-01-03 MED ORDER — ERYTHROMYCIN BASE 250 MG PO TABS: ORAL_TABLET | ORAL | Status: DC

## 2011-01-03 MED ORDER — HYDROCODONE-ACETAMINOPHEN 5-325 MG PO TABS
2.0000 | ORAL_TABLET | Freq: Once | ORAL | Status: AC
  Administered 2011-01-03: 2 via ORAL
  Filled 2011-01-03: qty 2

## 2011-01-03 MED ORDER — ERYTHROMYCIN BASE 250 MG PO TABS
250.0000 mg | ORAL_TABLET | Freq: Once | ORAL | Status: AC
  Administered 2011-01-03: 250 mg via ORAL
  Filled 2011-01-03: qty 1

## 2011-01-03 MED ORDER — PROMETHAZINE HCL 12.5 MG PO TABS
25.0000 mg | ORAL_TABLET | Freq: Once | ORAL | Status: AC
  Administered 2011-01-03: 25 mg via ORAL
  Filled 2011-01-03: qty 2

## 2011-01-03 MED ORDER — HYDROCODONE-ACETAMINOPHEN 5-325 MG PO TABS: 1.0000 | ORAL_TABLET | ORAL | Status: AC | PRN

## 2011-01-03 NOTE — ED Provider Notes (Signed)
History     CSN: [id number]  Arrival date & time 01/03/11  2115   First MD Initiated Contact with Patient 01/03/11 2143      Chief Complaint  Patient presents with  . Dental Problem    (Consider location/radiation/quality/duration/timing/severity/associated sxs/prior treatment) HPI Comments: Patient complains of tooth pain and headache on the left side. This has been going on for several days. Approximately 3 weeks ago patient had 2 teeth removed. She continues however to have pain. She has contacted her dentist, who refers her to an Transport planner. She is been unable to see the oral surgeon for assistance with her pain due to recent holidays. She is taking over-the-counter medications including both Goody powders and ibuprofen. She admits that time she is taking these in excess due to the severity of her pain. She presents to the emergency department for assistance with her pain and discomfort.  The history is provided by the patient.    Past Medical History  Diagnosis Date  . Arthritis   . Low back pain   . COPD (chronic obstructive pulmonary disease)   . GERD (gastroesophageal reflux disease)   . Bipolar disorder   . Anxiety     Past Surgical History  Procedure Date  . Tubal ligation     Family History  Problem Relation Age of Onset  . Depression Mother   . Arthritis Mother   . Heart disease Father   . Diabetes Father   . COPD Father   . COPD Sister   . Arthritis Sister   . Alcohol abuse Sister   . Hypertension Brother   . Diabetes Brother     History  Substance Use Topics  . Smoking status: Current Everyday Smoker -- 0.5 packs/day    Types: Cigarettes  . Smokeless tobacco: Not on file  . Alcohol Use: No    OB History    Grav Para Term Preterm Abortions TAB SAB Ect Mult Living                  Review of Systems  Constitutional: Negative for activity change.       All ROS Neg except as noted in HPI  HENT: Positive for dental problem. Negative for  nosebleeds and neck pain.   Eyes: Negative for photophobia and discharge.  Respiratory: Negative for cough, shortness of breath and wheezing.   Cardiovascular: Negative for chest pain and palpitations.  Gastrointestinal: Negative for abdominal pain and blood in stool.  Genitourinary: Negative for dysuria, frequency and hematuria.  Musculoskeletal: Negative for back pain and arthralgias.  Skin: Negative.   Neurological: Negative for dizziness, seizures and speech difficulty.  Psychiatric/Behavioral: Negative for hallucinations and confusion.    Allergies  Cephalexin; Ketorolac tromethamine; Lansoprazole; and Penicillins  Home Medications   Current Outpatient Rx  Name Route Sig Dispense Refill  . ALBUTEROL 90 MCG/ACT IN AERS Inhalation Inhale 2 puffs into the lungs every 4 (four) hours as needed. For shortness of breath    . ASPIRIN-ACETAMINOPHEN 500-325 MG PO PACK Oral Take 1 packet by mouth daily as needed. For pain     . CLONAZEPAM 1 MG PO TABS Oral Take 1 tablet (1 mg total) by mouth 2 (two) times daily as needed for anxiety. 60 tablet 1  . FLUTICASONE-SALMETEROL 250-50 MCG/DOSE IN AEPB Inhalation Inhale 1 puff into the lungs 2 (two) times daily. 1 each 3  . IBUPROFEN 200 MG PO TABS Oral Take 800 mg by mouth 3 (three) times daily as needed.  For pain     . OMEPRAZOLE 20 MG PO CPDR Oral Take 1 capsule (20 mg total) by mouth 2 (two) times daily. 60 capsule 3  . CYCLOBENZAPRINE HCL 10 MG PO TABS Oral Take 1 tablet (10 mg total) by mouth at bedtime as needed for muscle spasms. 30 tablet 1    BP 121/59  Pulse 70  Temp(Src) 98.1 F (36.7 C) (Oral)  Resp 20  Ht 5\' 8"  (1.727 m)  Wt 222 lb (100.699 kg)  BMI 33.76 kg/m2  SpO2 100%  LMP 12/11/2010  Physical Exam  Nursing note and vitals reviewed. Constitutional: She is oriented to person, place, and time. She appears well-developed and well-nourished.  Non-toxic appearance.  HENT:  Head: Normocephalic.  Right Ear: Tympanic membrane  and external ear normal.  Left Ear: Tympanic membrane and external ear normal.       Dental caries noted from left canine (upper) to upper premolar. Mild to moderate gum swelling around these teeth. No visible abscess appreciated. Significant gum disease noted of upper and lower gums.  Eyes: EOM and lids are normal. Pupils are equal, round, and reactive to light.  Neck: Normal range of motion. Neck supple. Carotid bruit is not present.  Cardiovascular: Normal rate, regular rhythm, normal heart sounds, intact distal pulses and normal pulses.   Pulmonary/Chest: Breath sounds normal. No respiratory distress.  Abdominal: Soft. Bowel sounds are normal. There is no tenderness. There is no guarding.  Musculoskeletal: Normal range of motion.  Lymphadenopathy:       Head (right side): No submandibular adenopathy present.       Head (left side): No submandibular adenopathy present.    She has no cervical adenopathy.  Neurological: She is alert and oriented to person, place, and time. She has normal strength. No cranial nerve deficit or sensory deficit.  Skin: Skin is warm and dry.  Psychiatric: She has a normal mood and affect. Her speech is normal.    ED Course  Procedures (including critical care time)  Labs Reviewed - No data to display No results found.  Pulse oximetry 100% on room air. Within normal limits by my interpretation. Dx: 1. Dental Pain  2. Gum disease   MDM  I have reviewed nursing notes, vital signs, and all appropriate lab and imaging results for this patient. Patient advised to see her dentist, or a dentist of her choosing that can assist her with her pain. Patient strongly advised to stop using Goody powders. Patient advised to use over-the-counter medicines as prescribed only. Prescription for erythromycin and Norco given to patient.       Kathie Dike, [place] 01/03/11 2220

## 2011-01-03 NOTE — ED Notes (Signed)
Pain lt upper jaw,  Onset 1 week ago.

## 2011-01-04 NOTE — ED Provider Notes (Signed)
Medical screening examination/treatment/procedure(s) were performed by non-physician practitioner and as supervising physician I was immediately available for consultation/collaboration.   Benny Lennert, MD 01/04/11 [phone]

## 2011-01-15 ENCOUNTER — Ambulatory Visit (INDEPENDENT_AMBULATORY_CARE_PROVIDER_SITE_OTHER): Payer: Medicaid Other | Admitting: Family Medicine

## 2011-01-15 ENCOUNTER — Encounter: Payer: Self-pay | Admitting: Family Medicine

## 2011-01-15 VITALS — BP 126/74 | HR 80 | Resp 18 | Ht 68.0 in | Wt 232.1 lb

## 2011-01-15 DIAGNOSIS — Z20828 Contact with and (suspected) exposure to other viral communicable diseases: Secondary | ICD-10-CM

## 2011-01-15 DIAGNOSIS — F329 Major depressive disorder, single episode, unspecified: Secondary | ICD-10-CM

## 2011-01-15 DIAGNOSIS — IMO0002 Reserved for concepts with insufficient information to code with codable children: Secondary | ICD-10-CM

## 2011-01-15 DIAGNOSIS — F319 Bipolar disorder, unspecified: Secondary | ICD-10-CM

## 2011-01-15 DIAGNOSIS — F32A Depression, unspecified: Secondary | ICD-10-CM

## 2011-01-15 DIAGNOSIS — R51 Headache: Secondary | ICD-10-CM

## 2011-01-15 DIAGNOSIS — F3289 Other specified depressive episodes: Secondary | ICD-10-CM

## 2011-01-15 MED ORDER — BUTALBITAL-APAP-CAFFEINE 50-325-40 MG PO TABS: 1.0000 | ORAL_TABLET | Freq: Four times a day (QID) | ORAL | Status: DC | PRN

## 2011-01-15 MED ORDER — DOXEPIN HCL 100 MG PO CAPS: 100.0000 mg | ORAL_CAPSULE | Freq: Every evening | ORAL | Status: DC | PRN

## 2011-01-15 MED ORDER — CLONAZEPAM 1 MG PO TABS: 1.0000 mg | ORAL_TABLET | Freq: Two times a day (BID) | ORAL | Status: DC | PRN
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
## 2011-01-15 NOTE — Patient Instructions (Addendum)
Please call and schedule with psychiatry Take the doxepin at bedtime as needed Fiorcet for headaches as needed Please call the hotline- [employer]  [phone]  24 hour  [phone]     We will call with lab results I will check on MRI for you Keep appt with pain clinic F/U in 2 weeks

## 2011-01-16 DIAGNOSIS — R51 Headache: Secondary | ICD-10-CM | POA: Insufficient documentation

## 2011-01-16 DIAGNOSIS — R519 Headache, unspecified: Secondary | ICD-10-CM | POA: Insufficient documentation

## 2011-01-16 DIAGNOSIS — IMO0002 Reserved for concepts with insufficient information to code with codable children: Secondary | ICD-10-CM | POA: Insufficient documentation

## 2011-01-16 NOTE — Assessment & Plan Note (Signed)
F/U psych pt needs mood stabilizer, hopefully she will start abilify
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
## 2011-01-17 LAB — WET PREP BY MOLECULAR PROBE
Gardnerella vaginalis: NEGATIVE
Trichomonas vaginosis: NEGATIVE

## 2011-01-17 LAB — GC/CHLAMYDIA PROBE AMP, GENITAL: Chlamydia, DNA Probe: NEGATIVE

## 2011-01-23 ENCOUNTER — Telehealth: Payer: Self-pay | Admitting: Family Medicine

## 2011-01-23 NOTE — Telephone Encounter (Signed)
Pt has appt at [facility] for 01/26/2011 8:30. Pt is aware

## 2011-01-24 ENCOUNTER — Telehealth: Payer: Self-pay | Admitting: Family Medicine
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
## 2011-01-26 ENCOUNTER — Ambulatory Visit (HOSPITAL_COMMUNITY): Payer: Medicaid Other

## 2011-01-30 ENCOUNTER — Ambulatory Visit (HOSPITAL_COMMUNITY)
Admission: RE | Admit: 2011-01-30 | Discharge: 2011-01-30 | Disposition: A | Discharge status: Home / Self Care | Payer: Medicaid Other | Source: Ambulatory Visit | Attending: Family Medicine | Admitting: Family Medicine

## 2011-01-30 DIAGNOSIS — M545 Low back pain, unspecified: Secondary | ICD-10-CM | POA: Insufficient documentation

## 2011-01-30 DIAGNOSIS — M542 Cervicalgia: Secondary | ICD-10-CM

## 2011-01-30 DIAGNOSIS — M7130 Other bursal cyst, unspecified site: Secondary | ICD-10-CM | POA: Insufficient documentation

## 2011-01-30 DIAGNOSIS — M79609 Pain in unspecified limb: Secondary | ICD-10-CM | POA: Insufficient documentation

## 2011-01-30 DIAGNOSIS — M5126 Other intervertebral disc displacement, lumbar region: Secondary | ICD-10-CM | POA: Insufficient documentation

## 2011-02-08 ENCOUNTER — Ambulatory Visit (INDEPENDENT_AMBULATORY_CARE_PROVIDER_SITE_OTHER): Payer: Medicaid Other | Admitting: Family Medicine

## 2011-02-08 ENCOUNTER — Encounter: Payer: Self-pay | Admitting: Family Medicine

## 2011-02-08 VITALS — BP 128/72 | HR 91 | Resp 18 | Ht 68.0 in | Wt 229.0 lb

## 2011-02-08 DIAGNOSIS — F319 Bipolar disorder, unspecified: Secondary | ICD-10-CM

## 2011-02-08 DIAGNOSIS — M545 Low back pain, unspecified: Secondary | ICD-10-CM

## 2011-02-08 DIAGNOSIS — IMO0002 Reserved for concepts with insufficient information to code with codable children: Secondary | ICD-10-CM

## 2011-02-08 NOTE — Assessment & Plan Note (Signed)
Pt not willing to see psych at this time. Continue doxepin and klonopin. Depending on what pain management does, I will consider switching to xanax
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
## 2011-02-08 NOTE — Assessment & Plan Note (Signed)
Mild facet disease, pt to be seen by pain management, given copy of MRI.
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
## 2011-02-08 NOTE — Patient Instructions (Signed)
F/U in 2 months  Continue your current medications Please call after you see Dr. Gerilyn Pilgrim and I will make a decision about your xanax

## 2011-02-12 ENCOUNTER — Other Ambulatory Visit: Payer: Self-pay

## 2011-02-12 MED ORDER — CYCLOBENZAPRINE HCL 10 MG PO TABS: 10.0000 mg | ORAL_TABLET | Freq: Every evening | ORAL | Status: DC | PRN

## 2011-02-15 ENCOUNTER — Telehealth: Payer: Self-pay

## 2011-02-15 NOTE — Telephone Encounter (Signed)
Said she saw [facility] and he wanted her off the hydrocodone and he gave her #30 of the Opana 10mg . He said she could take either the klonopin or the xanax, either was fine, she just had to confirm which through you and let him know which so he can put it in her chart. She would prefer the xanax over the klonopin

## 2011-02-15 NOTE — Telephone Encounter (Signed)
She can be switched to Xanax 0.5mg  BID prn anxiety, #60, refills 1 . Please have Dr. Lodema Hong write the script, unless she can wait until Monday when I get back.

## 2011-02-19 ENCOUNTER — Other Ambulatory Visit: Payer: Self-pay

## 2011-02-19 MED ORDER — ALPRAZOLAM 0.5 MG PO TABS: 0.5000 mg | ORAL_TABLET | Freq: Two times a day (BID) | ORAL | Status: DC | PRN

## 2011-02-19 NOTE — Telephone Encounter (Signed)
Do you want me to order Xanax for pt. Doesn't look like it has been sent as of yet.

## 2011-02-19 NOTE — Telephone Encounter (Signed)
Med ordered. Awaiting signature.

## 2011-02-19 NOTE — Telephone Encounter (Signed)
That will be fine. 

## 2011-03-30 ENCOUNTER — Ambulatory Visit (INDEPENDENT_AMBULATORY_CARE_PROVIDER_SITE_OTHER): Payer: Medicaid Other | Admitting: Family Medicine

## 2011-03-30 ENCOUNTER — Encounter: Payer: Self-pay | Admitting: Family Medicine

## 2011-03-30 VITALS — BP 114/74 | HR 74 | Temp 98.7°F | Resp 15 | Ht 68.0 in | Wt 222.0 lb

## 2011-03-30 DIAGNOSIS — R197 Diarrhea, unspecified: Secondary | ICD-10-CM

## 2011-03-30 DIAGNOSIS — M545 Low back pain, unspecified: Secondary | ICD-10-CM

## 2011-03-30 DIAGNOSIS — R1110 Vomiting, unspecified: Secondary | ICD-10-CM

## 2011-03-30 MED ORDER — HYDROCODONE-ACETAMINOPHEN 5-500 MG PO TABS: 2.0000 | ORAL_TABLET | Freq: Two times a day (BID) | ORAL | Status: DC | PRN

## 2011-03-30 MED ORDER — PROMETHAZINE HCL 12.5 MG PO TABS: 12.5000 mg | ORAL_TABLET | Freq: Four times a day (QID) | ORAL | Status: DC | PRN

## 2011-03-30 MED ORDER — PROMETHAZINE HCL 25 MG/ML IJ SOLN
25.0000 mg | Freq: Once | INTRAMUSCULAR | Status: AC
  Administered 2011-03-30: 25 mg via INTRAMUSCULAR

## 2011-03-30 NOTE — Patient Instructions (Addendum)
Take the nausea medication every 6 hours Drink broth and plenty of fluids  Take the vicodin until you are seen by your pain doctor and then ask for medication to be changed Do not take the Opana Schedule a physical with me, come fasting

## 2011-04-01 DIAGNOSIS — R197 Diarrhea, unspecified: Secondary | ICD-10-CM | POA: Insufficient documentation

## 2011-04-01 DIAGNOSIS — R1110 Vomiting, unspecified: Secondary | ICD-10-CM | POA: Insufficient documentation

## 2011-04-01 NOTE — Assessment & Plan Note (Signed)
SHe is in pain management, however concern for Opana not agreeing with her, I am also concerned her symptoms will worsen off of high dose narcotics, she is given Lortab to be taken BID until her appt with her pain management physician for discussion of change in her pain meds.  Lortab 5-500mg , #30 tablets given, no refills.  Will fax my note to Dr. Gerilyn Pilgrim so that he is aware, that this is not a breech in her pain contract.

## 2011-04-01 NOTE — Assessment & Plan Note (Signed)
Her GI symptoms may be secondary to viral illness as she had respiratory symptoms last week, vs medication induced with the opana as she has had similar symptoms in the past. At this time phenergan given in the office which has calmed her symptoms, she is able to tolerate water without emesis. Script given for phenergan.

## 2011-04-01 NOTE — Progress Notes (Signed)
Subjective:    Patient ID: Shannon Reese, female    DOB: 26-Nov-1975, 36 y.o.   MRN: [id number]  HPI  Pt presents with nausea and vomiting on and off for the past 2 weeks, but consistent since last night. She has had a few days with diarrhea , NB as well. She has felt sick since starting Opana, which she has been on in the past and made her nausea and vomit. She also states though she had a virus with some coughing and not sure if she has caught something. She is not able to take her pain meds because it worsens the  Vomiting. She is unable to tolerate any foods and only small amounts of liquids   Review of Systems  GEN- + fatigue, fever, weight loss,weakness, recent illness HEENT- denies eye drainage, change in vision, nasal discharge, CVS- denies chest pain, palpitations RESP- denies SOB, +cough, wheeze ABD- + N/V, change in stools, abd pain GU- denies dysuria, +hematuria, dribbling, incontinence MSK- + joint pain, muscle aches, injury Neuro- +headache, dizziness, syncope, seizure activity       Objective:   Physical Exam GEN- No acute cardiopulmonary distress, alert and oriented x 3, ill appearing, emesis in office HEENT- slightly dry MMM, oropharynx clear, PERRL EOMI CVS-RRR, no murmur RESP-CTAB ABD- NABS, soft,mild epigastric tenderness, NB/NB emesis EXT- no edema Neuro- normal speech, normal mentation  Given Phenergan in office, able to tolerate water prior to discharge after resting for 45 minutes       Assessment & Plan:

## 2011-04-01 NOTE — Assessment & Plan Note (Signed)
None currently, push fluids

## 2011-04-05 ENCOUNTER — Encounter: Payer: Self-pay | Admitting: Family Medicine

## 2011-04-09 ENCOUNTER — Encounter: Payer: Self-pay | Admitting: Family Medicine

## 2011-04-16 ENCOUNTER — Telehealth: Payer: Self-pay | Admitting: Family Medicine

## 2011-04-16 MED ORDER — ALPRAZOLAM 0.5 MG PO TABS: 0.5000 mg | ORAL_TABLET | Freq: Two times a day (BID) | ORAL | Status: DC | PRN

## 2011-04-16 MED ORDER — CYCLOBENZAPRINE HCL 10 MG PO TABS: 10.0000 mg | ORAL_TABLET | Freq: Every evening | ORAL | Status: DC | PRN
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
## 2011-04-24 ENCOUNTER — Encounter: Payer: Medicaid Other | Admitting: Family Medicine

## 2011-04-27 ENCOUNTER — Encounter: Payer: Medicaid Other | Admitting: Family Medicine

## 2011-05-16 ENCOUNTER — Telehealth: Payer: Self-pay

## 2011-05-16 DIAGNOSIS — F419 Anxiety disorder, unspecified: Secondary | ICD-10-CM

## 2011-05-16 DIAGNOSIS — F319 Bipolar disorder, unspecified: Secondary | ICD-10-CM

## 2011-05-16 NOTE — Telephone Encounter (Signed)
States Dr Gerilyn Pilgrim is crazy and he is treating her like a drug addict he is not helping her at all. She hurts all the time and she is sick of it. She needs to come off the meds and not have to depend on it to feel normal. She is trying so bad to get a job at [employer] and she is asking for help from you. She is having bad withdrawals from the meds and needs something to help with that. Said if you do not feel comfortable, then if you could please send her to someone who can. She is trying to do better and straighten up and get a job and do the right thing. Said to call her asap about this

## 2011-05-17 MED ORDER — IBUPROFEN 800 MG PO TABS: 800.0000 mg | ORAL_TABLET | Freq: Three times a day (TID) | ORAL | Status: AC | PRN

## 2011-05-17 NOTE — Telephone Encounter (Signed)
I spoke with patient. She is upset because she has been labeled a drug addict. She states that she feels ill her thoughts are racing  And she has pain everywhere. She denies nausea vomiting diarrhea which is now passed.. She states she does not feel like she is physically going through withdrawal symptoms noted her last use of narcotics was approximately 6 weeks ago. She is still taking her Xanax but is not sure if this is helping very much. She states that she needs think she needs to be on something to help her come off narcotics I stated to her that she's been off of them for 6 weeks therefore I would not reinstate a new medication. She agreed to go see psychiatry I think her bipolar and anxiety is very severe she is a very she does not want to see any other pain clinic and I did advise her that she is now been discharged from to pain clinic therefore this will be difficult anyway. I will send in ibuprofen 800 mg for her headache and pain. I will refer her to a new psychiatrist.

## 2011-06-11 ENCOUNTER — Other Ambulatory Visit: Payer: Self-pay | Admitting: Family Medicine

## 2011-06-11 ENCOUNTER — Telehealth: Payer: Self-pay | Admitting: Family Medicine

## 2011-06-11 NOTE — Telephone Encounter (Signed)
Do you want to refill? 

## 2011-06-11 NOTE — Telephone Encounter (Signed)
Medication refilled

## 2011-06-27 ENCOUNTER — Ambulatory Visit: Payer: Medicaid Other | Admitting: Family Medicine

## 2011-06-27 ENCOUNTER — Encounter: Payer: Self-pay | Admitting: Family Medicine

## 2011-06-28 ENCOUNTER — Ambulatory Visit (INDEPENDENT_AMBULATORY_CARE_PROVIDER_SITE_OTHER): Payer: Medicaid Other | Admitting: Family Medicine

## 2011-06-28 ENCOUNTER — Encounter: Payer: Self-pay | Admitting: Family Medicine

## 2011-06-28 ENCOUNTER — Telehealth: Payer: Self-pay | Admitting: Family Medicine

## 2011-06-28 VITALS — BP 128/76 | HR 95 | Resp 18 | Ht 68.0 in | Wt 227.0 lb

## 2011-06-28 DIAGNOSIS — E669 Obesity, unspecified: Secondary | ICD-10-CM

## 2011-06-28 DIAGNOSIS — F17200 Nicotine dependence, unspecified, uncomplicated: Secondary | ICD-10-CM

## 2011-06-28 DIAGNOSIS — N946 Dysmenorrhea, unspecified: Secondary | ICD-10-CM

## 2011-06-28 DIAGNOSIS — F419 Anxiety disorder, unspecified: Secondary | ICD-10-CM

## 2011-06-28 DIAGNOSIS — R109 Unspecified abdominal pain: Secondary | ICD-10-CM | POA: Insufficient documentation

## 2011-06-28 DIAGNOSIS — F319 Bipolar disorder, unspecified: Secondary | ICD-10-CM

## 2011-06-28 DIAGNOSIS — Z720 Tobacco use: Secondary | ICD-10-CM

## 2011-06-28 DIAGNOSIS — F411 Generalized anxiety disorder: Secondary | ICD-10-CM

## 2011-06-28 DIAGNOSIS — R1110 Vomiting, unspecified: Secondary | ICD-10-CM

## 2011-06-28 DIAGNOSIS — R0789 Other chest pain: Secondary | ICD-10-CM

## 2011-06-28 DIAGNOSIS — J449 Chronic obstructive pulmonary disease, unspecified: Secondary | ICD-10-CM

## 2011-06-28 LAB — CBC WITH DIFFERENTIAL/PLATELET
Eosinophils Absolute: 0.3 10*3/uL (ref 0.0–0.7)
Eosinophils Relative: 4 % (ref 0–5)
HCT: 41 % (ref 36.0–46.0)
Hemoglobin: 14 g/dL (ref 12.0–15.0)
Lymphs Abs: 2.1 10*3/uL (ref 0.7–4.0)
MCH: 31.5 pg (ref 26.0–34.0)
MCHC: 34.1 g/dL (ref 30.0–36.0)
MCV: 92.3 fL (ref 78.0–100.0)
Monocytes Absolute: 0.3 10*3/uL (ref 0.1–1.0)
Monocytes Relative: 5 % (ref 3–12)
Neutrophils Relative %: 61 % (ref 43–77)
RBC: 4.44 MIL/uL (ref 3.87–5.11)

## 2011-06-28 LAB — COMPREHENSIVE METABOLIC PANEL
AST: 16 U/L (ref 0–37)
Alkaline Phosphatase: 64 U/L (ref 39–117)
BUN: 14 mg/dL (ref 6–23)
Creat: 0.71 mg/dL (ref 0.50–1.10)
Glucose, Bld: 99 mg/dL (ref 70–99)
Total Bilirubin: 0.3 mg/dL (ref 0.3–1.2)

## 2011-06-28 LAB — LIPID PANEL
HDL: 40 mg/dL (ref >39)
LDL Cholesterol: 94 mg/dL (ref 0–99)
Total CHOL/HDL Ratio: 3.9 Ratio
Triglycerides: 109 mg/dL (ref <150)

## 2011-06-28 LAB — LIPASE: Lipase: 15 U/L (ref 0–75)

## 2011-06-28 MED ORDER — ALPRAZOLAM 0.5 MG PO TABS
0.5000 mg | ORAL_TABLET | Freq: Three times a day (TID) | ORAL | Status: DC | PRN
Start: 2011-06-28 — End: 2011-07-11

## 2011-06-28 NOTE — Assessment & Plan Note (Signed)
unchanged

## 2011-06-28 NOTE — Telephone Encounter (Signed)
It was sent this am and it was sent again

## 2011-06-28 NOTE — Assessment & Plan Note (Signed)
Differentials include gallbladder etiology versus liver disease versus H. pylori. Possible colitis though no diarrhea at this time. She's been using a large amount of anti-inflammatories. She will stop all of the nonsteroidal anti-inflammatories. Labs will be obtained. Based on labs will decide whether CT scan or ultrasound as needed versus referral to gastroenterology.

## 2011-06-28 NOTE — Assessment & Plan Note (Signed)
Uncontrolled, will try another psychiatrist, she has stopped her doxepin stating it caused her weird dreams

## 2011-06-28 NOTE — Assessment & Plan Note (Signed)
She has a lot of anxiety and stress regarding her family situation as well as her chronic pain. I will increase her Xanax to 3 times a day. She really needs to establish with a psychiatrist we have discussed this on multiple visits as well as phone conversation

## 2011-06-28 NOTE — Patient Instructions (Signed)
You will be referred back to GYN- [facility] Get the labs done Stop the goody powders and Ibuprofen, take Tyelnol if needed Xanax increased to three times a day as needed We will try [facility] for psychiatry I will call with lab results and if imaging is needed

## 2011-06-28 NOTE — Assessment & Plan Note (Signed)
Work up for abdominal pain

## 2011-06-28 NOTE — Progress Notes (Signed)
Subjective:    Patient ID: Shannon Reese, female    DOB: 01-30-1975, 36 y.o.   MRN: [id number]  HPI Patient presents with abdominal pain, chest pain and emesis for the past month. She states she has a tightness in her chest she tried her albuterol inhaler which did not help she also tried taking her Xanax and this did call her down and she went to sleep. She denies any radiation of pain. She has emesis almost every single day. She takes a good amount of baby powder as well as ibuprofen for pain if she is not allowed narcotics at this time. Her bowels typically move once a week regular basis. She denies diarrhea. Her menstrual cycles have been very heavy and irregular she also has pelvic pain at times.   Review of Systems - per above    GEN- denies fatigue, fever, weight loss,weakness, recent illness HEENT- denies eye drainage, change in vision, nasal discharge, CVS- + chest pain, palpitations, RESP- denies SOB, cough, wheeze ABD- denies N/V, change in stools, +abd pain GU- denies dysuria, hematuria, dribbling, incontinence MSK- +joint pain, muscle aches, injury Neuro- denies headache, dizziness, syncope, seizure activity      Objective:   Physical Exam GEN- NAD, alert and oriented x3 HEENT-  non injected sclera,non icteric, MMM, oropharynx clear CVS- RRR, no murmur RESP-CTAB ABD-NABS,soft, TTP Diffusely,no rebound, no guarding no mass felt EXT- No edema Pulses- Radial, DP- 2+ Psych-depressed affect, not overly anxious, no hallucinations,no apparent SI       Assessment & Plan:

## 2011-06-28 NOTE — Assessment & Plan Note (Signed)
This does not appear to be cardiac chest pain. I think this is either her anxiety versus possible GI etiology for her overuse of NSAIDs and abdominal pain.

## 2011-06-28 NOTE — Assessment & Plan Note (Signed)
Currently stable, her test tightness does not appear to be exacerbation today, continue advair

## 2011-06-28 NOTE — Assessment & Plan Note (Signed)
Refer to GYN for PAP smear and work up, she has been seen in the past and wants to re-establish

## 2011-06-29 ENCOUNTER — Telehealth: Payer: Self-pay | Admitting: Family Medicine

## 2011-07-09 ENCOUNTER — Encounter: Payer: Self-pay | Admitting: Family Medicine

## 2011-07-10 ENCOUNTER — Telehealth: Payer: Self-pay | Admitting: Family Medicine

## 2011-07-10 NOTE — Telephone Encounter (Signed)
Patient is aware 

## 2011-07-11 ENCOUNTER — Other Ambulatory Visit: Payer: Self-pay

## 2011-07-11 ENCOUNTER — Telehealth: Payer: Self-pay | Admitting: Family Medicine

## 2011-07-11 MED ORDER — ALPRAZOLAM 0.5 MG PO TABS: 0.5000 mg | ORAL_TABLET | Freq: Three times a day (TID) | ORAL | Status: DC | PRN

## 2011-07-11 NOTE — Telephone Encounter (Signed)
Med refilled.

## 2011-07-13 NOTE — Telephone Encounter (Signed)
meds refilled 

## 2011-07-16 ENCOUNTER — Telehealth: Payer: Self-pay | Admitting: Family Medicine

## 2011-07-24 ENCOUNTER — Other Ambulatory Visit: Payer: Self-pay | Admitting: Obstetrics & Gynecology

## 2011-07-24 ENCOUNTER — Other Ambulatory Visit (HOSPITAL_COMMUNITY)
Admission: RE | Admit: 2011-07-24 | Discharge: 2011-07-24 | Disposition: A | Discharge status: Home / Self Care | Payer: Medicaid Other | Source: Ambulatory Visit | Attending: Obstetrics & Gynecology | Admitting: Obstetrics & Gynecology

## 2011-07-24 DIAGNOSIS — Z01419 Encounter for gynecological examination (general) (routine) without abnormal findings: Secondary | ICD-10-CM | POA: Insufficient documentation

## 2011-07-30 ENCOUNTER — Other Ambulatory Visit: Payer: Self-pay | Admitting: Family Medicine

## 2011-07-31 NOTE — Telephone Encounter (Signed)
Patient is aware 

## 2011-08-14 ENCOUNTER — Emergency Department (HOSPITAL_COMMUNITY)
Admission: EM | Admit: 2011-08-14 | Discharge: 2011-08-14 | Disposition: A | Discharge status: Home / Self Care | Payer: Medicaid Other | Attending: Emergency Medicine | Admitting: Emergency Medicine

## 2011-08-14 ENCOUNTER — Encounter (HOSPITAL_COMMUNITY): Payer: Self-pay

## 2011-08-14 DIAGNOSIS — J449 Chronic obstructive pulmonary disease, unspecified: Secondary | ICD-10-CM | POA: Insufficient documentation

## 2011-08-14 DIAGNOSIS — F319 Bipolar disorder, unspecified: Secondary | ICD-10-CM | POA: Insufficient documentation

## 2011-08-14 DIAGNOSIS — S025XXA Fracture of tooth (traumatic), initial encounter for closed fracture: Secondary | ICD-10-CM

## 2011-08-14 DIAGNOSIS — M129 Arthropathy, unspecified: Secondary | ICD-10-CM | POA: Insufficient documentation

## 2011-08-14 DIAGNOSIS — F17200 Nicotine dependence, unspecified, uncomplicated: Secondary | ICD-10-CM | POA: Insufficient documentation

## 2011-08-14 DIAGNOSIS — F411 Generalized anxiety disorder: Secondary | ICD-10-CM | POA: Insufficient documentation

## 2011-08-14 DIAGNOSIS — X58XXXA Exposure to other specified factors, initial encounter: Secondary | ICD-10-CM | POA: Insufficient documentation

## 2011-08-14 DIAGNOSIS — J4489 Other specified chronic obstructive pulmonary disease: Secondary | ICD-10-CM | POA: Insufficient documentation

## 2011-08-14 DIAGNOSIS — K219 Gastro-esophageal reflux disease without esophagitis: Secondary | ICD-10-CM | POA: Insufficient documentation

## 2011-08-14 MED ORDER — HYDROCODONE-ACETAMINOPHEN 5-500 MG PO TABS: 1.0000 | ORAL_TABLET | Freq: Four times a day (QID) | ORAL | Status: DC | PRN

## 2011-08-14 MED ORDER — ERYTHROMYCIN BASE 250 MG PO TABS: 250.0000 mg | ORAL_TABLET | Freq: Four times a day (QID) | ORAL | Status: DC

## 2011-08-14 MED ORDER — MELOXICAM 7.5 MG PO TABS: 15.0000 mg | ORAL_TABLET | Freq: Every day | ORAL | Status: DC

## 2011-08-14 MED ORDER — HYDROCODONE-ACETAMINOPHEN 5-325 MG PO TABS
2.0000 | ORAL_TABLET | Freq: Once | ORAL | Status: AC
  Administered 2011-08-14: 2 via ORAL
  Filled 2011-08-14: qty 2

## 2011-08-14 NOTE — ED Provider Notes (Addendum)
History     CSN: [id number]  Arrival date & time 08/14/11  0310   First MD Initiated Contact with Patient 08/14/11 [phone]      Chief Complaint  Patient presents with  . Dental Pain    (Consider location/radiation/quality/duration/timing/severity/associated sxs/prior treatment) HPI Comments: Chief Complaint:  Dental Pain  patient and past medical records  Shannon Reese 36 y.o. female  Dental pain started abrupt starting 2 days ago The tooth/teeth that hurt are Left upper molar and premolar The pain is persistent Severity moderate It occurred while Chewing on a meal when her tooth broke Pain is worse with Chewing Therapy tried prior to arrival Nothing Patient has had recent evaluation by a dentist - Had multiple teeth pulled and several cavities filled. She was told that she needed to have her teeth all pulled and have dentures fitted. She is scheduled to see a consultation for this in the next several weeks. Has a dental history of poor dentition  She denies fevers chills nausea vomiting, facial swelling or neck swelling        The history is provided by the patient and medical records.    Past Medical History  Diagnosis Date  . Arthritis   . Low back pain   . COPD (chronic obstructive pulmonary disease)   . GERD (gastroesophageal reflux disease)   . Bipolar disorder   . Anxiety   . Facet joint disease MRI 2012    Past Surgical History  Procedure Date  . Tubal ligation     Family History  Problem Relation Age of Onset  . Depression Mother   . Arthritis Mother   . Heart disease Father   . Diabetes Father   . COPD Father   . COPD Sister   . Arthritis Sister   . Alcohol abuse Sister   . Hypertension Brother   . Diabetes Brother     History  Substance Use Topics  . Smoking status: Current Everyday Smoker -- 0.5 packs/day    Types: Cigarettes  . Smokeless tobacco: Not on file  . Alcohol Use: No    OB History    Grav Para Term Preterm  Abortions TAB SAB Ect Mult Living                  Review of Systems  Constitutional: Negative for fever and chills.  HENT: Positive for dental problem. Negative for sore throat, facial swelling, trouble swallowing and voice change.        Toothache  Gastrointestinal: Negative for nausea and vomiting.    Allergies  Cephalexin; Ketorolac tromethamine; Lansoprazole; and Penicillins  Home Medications   Current Outpatient Rx  Name Route Sig Dispense Refill  . ALBUTEROL 90 MCG/ACT IN AERS Inhalation Inhale 2 puffs into the lungs every 4 (four) hours as needed. For shortness of breath    . ALPRAZOLAM 0.5 MG PO TABS Oral Take 1 tablet (0.5 mg total) by mouth 3 (three) times daily as needed for sleep. 90 tablet 0  . ASPIRIN-ACETAMINOPHEN 500-325 MG PO PACK Oral Take 1 packet by mouth daily as needed. For pain     . BUTALBITAL-APAP-CAFFEINE 50-325-40 MG PO TABS Oral Take 1 tablet by mouth every 6 (six) hours as needed for headache. 30 tablet 0  . CYCLOBENZAPRINE HCL 10 MG PO TABS Oral Take 1 tablet (10 mg total) by mouth at bedtime as needed for muscle spasms. 30 tablet 2  . ERYTHROMYCIN BASE 250 MG PO TABS Oral Take 1 tablet (250  mg total) by mouth every 6 (six) hours. 28 tablet 0  . FLUTICASONE-SALMETEROL 250-50 MCG/DOSE IN AEPB Inhalation Inhale 1 puff into the lungs 2 (two) times daily. 1 each 3  . HYDROCODONE-ACETAMINOPHEN 5-500 MG PO TABS Oral Take 1-2 tablets by mouth every 6 (six) hours as needed for pain. 15 tablet 0  . MELOXICAM 7.5 MG PO TABS Oral Take 2 tablets (15 mg total) by mouth daily. 30 tablet 0  . OMEPRAZOLE 20 MG PO CPDR Oral Take 1 capsule (20 mg total) by mouth 2 (two) times daily. 60 capsule 3  . VENTOLIN HFA 108 (90 BASE) MCG/ACT IN AERS  INHALE 2 PUFFS BY MOUTH AS DIRECTED 18 each 3    BP 127/81  Pulse 85  Temp 98.4 F (36.9 C) (Oral)  Resp 16  Ht 5\' 8"  (1.727 m)  Wt 226 lb (102.513 kg)  BMI 34.36 kg/m2  SpO2 99%  Physical Exam  Nursing note and vitals  reviewed. Constitutional: She appears well-developed and well-nourished. No distress.  HENT:  Head: Normocephalic and atraumatic.  Mouth/Throat: Oropharynx is clear and moist. No oropharyngeal exudate.       Dental Disease - 2 fractured teeth, left upper jaw, rear two teeth, no surrounding erythema, abscess, swelling.  No ttp under the tongue  Eyes: Conjunctivae are normal. No scleral icterus.  Neck: Normal range of motion. Neck supple. No thyromegaly present.  Cardiovascular: Normal rate and regular rhythm.   Pulmonary/Chest: Effort normal and breath sounds normal.  Lymphadenopathy:    She has no cervical adenopathy.  Neurological: She is alert.  Skin: Skin is warm and dry. No rash noted. She is not diaphoretic.    ED Course  Procedures (including critical care time)  Labs Reviewed - No data to display No results found.   1. Tooth fracture       MDM  Nontoxic appearing patient with normal vital signs and dental pain after fracture, dental followup encouraged, patient has dentist in this city with whom she can folowup. Hydrocodone given in the emergency department, patient endorsed no allergic reaction to ibuprofen, meloxicam prescribed, hydrocodone and erythromycin.  Discharge Prescriptions include:  Meloxicam Hydrocodone Erthromycin        Vida Roller, MD 08/14/11 [id number]  Vida Roller, MD 08/14/11 [phone]

## 2011-08-14 NOTE — ED Notes (Signed)
Pt c/o left sided mouth pain from broken tooth x 2days

## 2011-08-19 ENCOUNTER — Encounter (HOSPITAL_COMMUNITY): Payer: Self-pay | Admitting: Emergency Medicine

## 2011-08-19 ENCOUNTER — Emergency Department (HOSPITAL_COMMUNITY)
Admission: EM | Admit: 2011-08-19 | Discharge: 2011-08-19 | Disposition: A | Discharge status: Home / Self Care | Payer: Medicaid Other | Attending: Emergency Medicine | Admitting: Emergency Medicine

## 2011-08-19 DIAGNOSIS — J449 Chronic obstructive pulmonary disease, unspecified: Secondary | ICD-10-CM | POA: Insufficient documentation

## 2011-08-19 DIAGNOSIS — Z880 Allergy status to penicillin: Secondary | ICD-10-CM | POA: Insufficient documentation

## 2011-08-19 DIAGNOSIS — K029 Dental caries, unspecified: Secondary | ICD-10-CM | POA: Insufficient documentation

## 2011-08-19 DIAGNOSIS — F319 Bipolar disorder, unspecified: Secondary | ICD-10-CM | POA: Insufficient documentation

## 2011-08-19 DIAGNOSIS — F17200 Nicotine dependence, unspecified, uncomplicated: Secondary | ICD-10-CM | POA: Insufficient documentation

## 2011-08-19 DIAGNOSIS — K219 Gastro-esophageal reflux disease without esophagitis: Secondary | ICD-10-CM | POA: Insufficient documentation

## 2011-08-19 DIAGNOSIS — J4489 Other specified chronic obstructive pulmonary disease: Secondary | ICD-10-CM | POA: Insufficient documentation

## 2011-08-19 DIAGNOSIS — F411 Generalized anxiety disorder: Secondary | ICD-10-CM | POA: Insufficient documentation

## 2011-08-19 MED ORDER — OXYCODONE-ACETAMINOPHEN 5-325 MG PO TABS: 1.0000 | ORAL_TABLET | ORAL | Status: AC | PRN

## 2011-08-19 MED ORDER — OXYCODONE-ACETAMINOPHEN 5-325 MG PO TABS: 2.0000 | ORAL_TABLET | ORAL | Status: DC | PRN

## 2011-08-19 NOTE — ED Notes (Signed)
Here for pain control for dental problems

## 2011-08-19 NOTE — ED Notes (Signed)
Percocet OP pack given to go.

## 2011-08-19 NOTE — ED Notes (Signed)
Patient complaining of dental pain to left side. Drove self to ED. States was seen a week ago for same, hasn't been able to see a dentist yet. Patient also reports sticking broken qtips through broken teeth to "try to kill the nerves."

## 2011-08-21 MED FILL — Oxycodone w/ Acetaminophen Tab 5-325 MG: ORAL | Qty: 6 | Status: AC

## 2011-08-21 NOTE — ED Provider Notes (Signed)
History     CSN: [id number]  Arrival date & time 08/19/11  2002   First MD Initiated Contact with Patient 08/19/11 2025      Chief Complaint  Patient presents with  . Dental Pain    (Consider location/radiation/quality/duration/timing/severity/associated sxs/prior treatment) HPI Comments: Shannon Reese  presents with a 2 week history of dental pain and gingival swelling.   The patient has a history of poor dentition caused by gingivitis and is currently  A patient of Dr. Blondell Reveal.  Unfortunately, she cannot get in to see him until next week for hopeful dental extraction.  She was seen her last week and placed on erythrocyin which she is still taking is also taking hydrocodone but reports this has not helped her pain.  She reports trying to stuff the cotton from a qtip into the dental space which has not helped. There has been no fevers,  Chills, nausea or vomiting, also no complaint of difficulty swallowing,  Although chewing makes pain worse.     The history is provided by the patient.    Past Medical History  Diagnosis Date  . Arthritis   . Low back pain   . COPD (chronic obstructive pulmonary disease)   . GERD (gastroesophageal reflux disease)   . Bipolar disorder   . Anxiety   . Facet joint disease MRI 2012    Past Surgical History  Procedure Date  . Tubal ligation     Family History  Problem Relation Age of Onset  . Depression Mother   . Arthritis Mother   . Heart disease Father   . Diabetes Father   . COPD Father   . COPD Sister   . Arthritis Sister   . Alcohol abuse Sister   . Hypertension Brother   . Diabetes Brother     History  Substance Use Topics  . Smoking status: Current Everyday Smoker -- 0.5 packs/day    Types: Cigarettes  . Smokeless tobacco: Not on file  . Alcohol Use: No    OB History    Grav Para Term Preterm Abortions TAB SAB Ect Mult Living                  Review of Systems  Constitutional: Negative for fever.  HENT:  Positive for dental problem. Negative for sore throat, facial swelling, neck pain and neck stiffness.   Respiratory: Negative for shortness of breath.     Allergies  Penicillins; Bee venom; Cephalexin; Lansoprazole; and Tramadol  Home Medications   Current Outpatient Rx  Name Route Sig Dispense Refill  . ALBUTEROL 90 MCG/ACT IN AERS Inhalation Inhale 2 puffs into the lungs every 4 (four) hours as needed. For shortness of breath    . ALPRAZOLAM 0.5 MG PO TABS Oral Take 0.5 mg by mouth 3 (three) times daily as needed.    . ASPIRIN-ACETAMINOPHEN 500-325 MG PO PACK Oral Take 1 packet by mouth daily as needed. For pain     . CYCLOBENZAPRINE HCL 10 MG PO TABS Oral Take 1 tablet (10 mg total) by mouth at bedtime as needed for muscle spasms. 30 tablet 2  . FLUTICASONE-SALMETEROL 250-50 MCG/DOSE IN AEPB Inhalation Inhale 1 puff into the lungs 2 (two) times daily. 1 each 3  . MELOXICAM 7.5 MG PO TABS Oral Take 2 tablets (15 mg total) by mouth daily. 30 tablet 0  . OMEPRAZOLE 20 MG PO CPDR Oral Take 1 capsule (20 mg total) by mouth 2 (two) times daily. 60 capsule 3  .  ERYTHROMYCIN BASE 250 MG PO TABS Oral Take 1 tablet (250 mg total) by mouth every 6 (six) hours. 28 tablet 0  . HYDROCODONE-ACETAMINOPHEN 5-500 MG PO TABS Oral Take 1-2 tablets by mouth every 6 (six) hours as needed for pain. 15 tablet 0  . OXYCODONE-ACETAMINOPHEN 5-325 MG PO TABS Oral Take 2 tablets by mouth every 4 (four) hours as needed for pain. 6 tablet 0  . OXYCODONE-ACETAMINOPHEN 5-325 MG PO TABS Oral Take 1 tablet by mouth every 4 (four) hours as needed for pain. 14 tablet 0    BP 126/76  Pulse 96  Temp 99.1 F (37.3 C) (Oral)  Resp 18  Ht 5\' 8"  (1.727 m)  Wt 226 lb (102.513 kg)  BMI 34.36 kg/m2  SpO2 100%  Physical Exam  Constitutional: She is oriented to person, place, and time. She appears well-developed and well-nourished. No distress.  HENT:  Head: Normocephalic and atraumatic.  Right Ear: Tympanic membrane and  external ear normal.  Left Ear: Tympanic membrane and external ear normal.  Mouth/Throat: Oropharynx is clear and moist and mucous membranes are normal. No oral lesions. Abnormal dentition. Dental caries present.       Generalized poor dentition with several missing molars,  Multiple areas of enamel erosion and large caries.  Gingival erythema and inflammation.  Eyes: Conjunctivae are normal.  Neck: Normal range of motion. Neck supple.  Cardiovascular: Normal rate and normal heart sounds.   Pulmonary/Chest: Effort normal.  Abdominal: She exhibits no distension.  Musculoskeletal: Normal range of motion.  Lymphadenopathy:    She has no cervical adenopathy.  Neurological: She is alert and oriented to person, place, and time.  Skin: Skin is warm and dry. No erythema.  Psychiatric: She has a normal mood and affect.    ED Course  Procedures (including critical care time)  Labs Reviewed - No data to display No results found.   1. Dental decay       MDM  Pt switches to oxycodone for pain relief.  Encouraged to complete course of erythromycin and f/u with her dentist.        Burgess Amor, PA 08/21/11 1416

## 2011-08-23 NOTE — ED Provider Notes (Signed)
Medical screening examination/treatment/procedure(s) were performed by non-physician practitioner and as supervising physician I was immediately available for consultation/collaboration.   Hila Bolding, MD 08/23/11 0826 

## 2011-08-25 ENCOUNTER — Emergency Department (HOSPITAL_COMMUNITY)
Admission: EM | Admit: 2011-08-25 | Discharge: 2011-08-25 | Disposition: A | Discharge status: Home / Self Care | Payer: Medicaid Other | Attending: Emergency Medicine | Admitting: Emergency Medicine

## 2011-08-25 ENCOUNTER — Encounter (HOSPITAL_COMMUNITY): Payer: Self-pay | Admitting: *Deleted

## 2011-08-25 DIAGNOSIS — F411 Generalized anxiety disorder: Secondary | ICD-10-CM | POA: Insufficient documentation

## 2011-08-25 DIAGNOSIS — J4489 Other specified chronic obstructive pulmonary disease: Secondary | ICD-10-CM | POA: Insufficient documentation

## 2011-08-25 DIAGNOSIS — Z881 Allergy status to other antibiotic agents status: Secondary | ICD-10-CM | POA: Insufficient documentation

## 2011-08-25 DIAGNOSIS — K089 Disorder of teeth and supporting structures, unspecified: Secondary | ICD-10-CM | POA: Insufficient documentation

## 2011-08-25 DIAGNOSIS — K0889 Other specified disorders of teeth and supporting structures: Secondary | ICD-10-CM

## 2011-08-25 DIAGNOSIS — F319 Bipolar disorder, unspecified: Secondary | ICD-10-CM | POA: Insufficient documentation

## 2011-08-25 DIAGNOSIS — K219 Gastro-esophageal reflux disease without esophagitis: Secondary | ICD-10-CM | POA: Insufficient documentation

## 2011-08-25 DIAGNOSIS — Z91038 Other insect allergy status: Secondary | ICD-10-CM | POA: Insufficient documentation

## 2011-08-25 DIAGNOSIS — Z880 Allergy status to penicillin: Secondary | ICD-10-CM | POA: Insufficient documentation

## 2011-08-25 DIAGNOSIS — J449 Chronic obstructive pulmonary disease, unspecified: Secondary | ICD-10-CM | POA: Insufficient documentation

## 2011-08-25 DIAGNOSIS — F17200 Nicotine dependence, unspecified, uncomplicated: Secondary | ICD-10-CM | POA: Insufficient documentation

## 2011-08-25 DIAGNOSIS — Z886 Allergy status to analgesic agent status: Secondary | ICD-10-CM | POA: Insufficient documentation

## 2011-08-25 MED ORDER — OXYCODONE-ACETAMINOPHEN 5-325 MG PO TABS: 1.0000 | ORAL_TABLET | Freq: Four times a day (QID) | ORAL | Status: AC | PRN

## 2011-08-25 NOTE — ED Provider Notes (Signed)
History     CSN: [id number]  Arrival date & time 08/25/11  4098   First MD Initiated Contact with Patient 08/25/11 0940      Chief Complaint  Patient presents with  . Dental Pain    (Consider location/radiation/quality/duration/timing/severity/associated sxs/prior treatment) Patient is a 36 y.o. female presenting with tooth pain. The history is provided by the patient.  Dental PainThe primary symptoms include mouth pain. Primary symptoms do not include shortness of breath or cough. The symptoms began more than 1 month ago. The symptoms are unchanged. The symptoms occur constantly.  Additional symptoms include: dental sensitivity to temperature, gum swelling and jaw pain. Additional symptoms do not include: nosebleeds. Medical issues include: smoking.    Past Medical History  Diagnosis Date  . Arthritis   . Low back pain   . COPD (chronic obstructive pulmonary disease)   . GERD (gastroesophageal reflux disease)   . Bipolar disorder   . Anxiety   . Facet joint disease MRI 2012    Past Surgical History  Procedure Date  . Tubal ligation     Family History  Problem Relation Age of Onset  . Depression Mother   . Arthritis Mother   . Heart disease Father   . Diabetes Father   . COPD Father   . COPD Sister   . Arthritis Sister   . Alcohol abuse Sister   . Hypertension Brother   . Diabetes Brother     History  Substance Use Topics  . Smoking status: Current Everyday Smoker -- 0.5 packs/day    Types: Cigarettes  . Smokeless tobacco: Not on file  . Alcohol Use: No    OB History    Grav Para Term Preterm Abortions TAB SAB Ect Mult Living                  Review of Systems  Constitutional: Negative for activity change.       All ROS Neg except as noted in HPI  HENT: Positive for dental problem. Negative for nosebleeds and neck pain.   Eyes: Negative for photophobia and discharge.  Respiratory: Negative for cough, shortness of breath and wheezing.     Cardiovascular: Negative for chest pain and palpitations.  Gastrointestinal: Negative for abdominal pain and blood in stool.  Genitourinary: Negative for dysuria, frequency and hematuria.  Musculoskeletal: Positive for back pain and arthralgias.  Skin: Negative.   Neurological: Negative for dizziness, seizures and speech difficulty.  Psychiatric/Behavioral: Negative for hallucinations and confusion. The patient is nervous/anxious.     Allergies  Penicillins; Bee venom; Cephalexin; Lansoprazole; and Tramadol  Home Medications   Current Outpatient Rx  Name Route Sig Dispense Refill  . ALBUTEROL 90 MCG/ACT IN AERS Inhalation Inhale 2 puffs into the lungs every 4 (four) hours as needed. For shortness of breath    . ALPRAZOLAM 0.5 MG PO TABS Oral Take 0.5 mg by mouth 3 (three) times daily as needed. For anxiety    . GOODY HEADACHE PO Oral Take 1-2 packets by mouth as needed. For headaches or pain    . CYCLOBENZAPRINE HCL 10 MG PO TABS Oral Take 1 tablet (10 mg total) by mouth at bedtime as needed for muscle spasms. 30 tablet 2  . ERYTHROMYCIN BASE 250 MG PO TABS Oral Take 250 mg by mouth 3 (three) times daily.    . MELOXICAM 7.5 MG PO TABS Oral Take 2 tablets (15 mg total) by mouth daily. 30 tablet 0  . OMEPRAZOLE 20 MG PO  CPDR Oral Take 1 capsule (20 mg total) by mouth 2 (two) times daily. 60 capsule 3  . OXYCODONE-ACETAMINOPHEN 5-325 MG PO TABS Oral Take 1 tablet by mouth every 4 (four) hours as needed for pain. 14 tablet 0  . OXYCODONE-ACETAMINOPHEN 5-325 MG PO TABS Oral Take 1 tablet by mouth every 6 (six) hours as needed for pain. 15 tablet 0    BP 130/73  Pulse 60  Temp 97.9 F (36.6 C) (Oral)  Resp 16  SpO2 98%  LMP 08/25/2011  Physical Exam  Nursing note and vitals reviewed. Constitutional: She is oriented to person, place, and time. She appears well-developed and well-nourished.  Non-toxic appearance.  HENT:  Head: Normocephalic. No trismus in the jaw.  Right Ear:  Tympanic membrane and external ear normal.  Left Ear: Tympanic membrane and external ear normal.  Mouth/Throat: Abnormal dentition. Dental caries present. No uvula swelling.  Eyes: EOM and lids are normal. Pupils are equal, round, and reactive to light.  Neck: Normal range of motion. Neck supple. Carotid bruit is not present.  Cardiovascular: Normal rate, regular rhythm, normal heart sounds, intact distal pulses and normal pulses.   Pulmonary/Chest: Breath sounds normal. No respiratory distress.  Abdominal: Soft. Bowel sounds are normal. There is no tenderness. There is no guarding.  Musculoskeletal: Normal range of motion.  Lymphadenopathy:       Head (right side): No submandibular adenopathy present.       Head (left side): No submandibular adenopathy present.    She has no cervical adenopathy.  Neurological: She is alert and oriented to person, place, and time. She has normal strength. No cranial nerve deficit or sensory deficit.  Skin: Skin is warm and dry.  Psychiatric: She has a normal mood and affect. Her speech is normal.    ED Course  Procedures (including critical care time)  Labs Reviewed - No data to display No results found.   1. Toothache       MDM  I have reviewed nursing notes, vital signs, and all appropriate lab and imaging results for this patient. Pt ran out of percocet. Her dental appointment is in 1 to 2 weeks. No new exam changes. No high fever. No swelling under the tongue. Uvula midline. Airway patent. Rx Percocet #15 tabs given.       Kathie Dike, [place] 08/25/11 1018

## 2011-08-25 NOTE — ED Provider Notes (Signed)
Medical screening examination/treatment/procedure(s) were performed by non-physician practitioner and as supervising physician I was immediately available for consultation/collaboration.   [place] Yepiz M Carlisha Wisler, DO 08/25/11 1832 

## 2011-08-25 NOTE — ED Notes (Signed)
Dental pain x 2 weeks. Has appt with dentist Aug 30th. Unalbe to eat or sleep due to pain.

## 2011-09-03 ENCOUNTER — Other Ambulatory Visit: Payer: Self-pay | Admitting: Family Medicine

## 2011-09-11 ENCOUNTER — Other Ambulatory Visit: Payer: Self-pay | Admitting: Family Medicine

## 2011-09-11 ENCOUNTER — Other Ambulatory Visit: Payer: Self-pay

## 2011-09-11 MED ORDER — CYCLOBENZAPRINE HCL 10 MG PO TABS: 10.0000 mg | ORAL_TABLET | Freq: Every evening | ORAL | Status: DC | PRN

## 2011-09-24 ENCOUNTER — Other Ambulatory Visit: Payer: Self-pay | Admitting: Family Medicine

## 2011-09-26 ENCOUNTER — Ambulatory Visit: Payer: Medicaid Other | Admitting: Family Medicine

## 2011-10-01 ENCOUNTER — Ambulatory Visit (INDEPENDENT_AMBULATORY_CARE_PROVIDER_SITE_OTHER): Payer: Medicaid Other

## 2011-10-01 VITALS — BP 128/76 | Wt 228.0 lb

## 2011-10-01 DIAGNOSIS — Z111 Encounter for screening for respiratory tuberculosis: Secondary | ICD-10-CM

## 2011-10-01 NOTE — Progress Notes (Signed)
Patient in for PPD placement.  TB given in left forearm.  No sign or symptom of adverse reaction.  Patient aware that she should return on Wednesday to have PPD read.

## 2011-10-03 ENCOUNTER — Other Ambulatory Visit: Payer: Self-pay | Admitting: Obstetrics and Gynecology

## 2011-10-03 LAB — TB SKIN TEST: TB Skin Test: NEGATIVE

## 2011-10-08 ENCOUNTER — Ambulatory Visit (INDEPENDENT_AMBULATORY_CARE_PROVIDER_SITE_OTHER): Payer: Medicaid Other | Admitting: Family Medicine

## 2011-10-08 ENCOUNTER — Encounter: Payer: Self-pay | Admitting: Family Medicine

## 2011-10-08 VITALS — BP 122/82 | HR 83 | Resp 16 | Ht 68.0 in | Wt 225.8 lb

## 2011-10-08 DIAGNOSIS — Z720 Tobacco use: Secondary | ICD-10-CM

## 2011-10-08 DIAGNOSIS — M5380 Other specified dorsopathies, site unspecified: Secondary | ICD-10-CM

## 2011-10-08 DIAGNOSIS — F17200 Nicotine dependence, unspecified, uncomplicated: Secondary | ICD-10-CM

## 2011-10-08 DIAGNOSIS — J449 Chronic obstructive pulmonary disease, unspecified: Secondary | ICD-10-CM

## 2011-10-08 DIAGNOSIS — M62830 Muscle spasm of back: Secondary | ICD-10-CM

## 2011-10-08 MED ORDER — IBUPROFEN 800 MG PO TABS: 800.0000 mg | ORAL_TABLET | Freq: Three times a day (TID) | ORAL | Status: DC | PRN

## 2011-10-08 MED ORDER — CARISOPRODOL 350 MG PO TABS: 350.0000 mg | ORAL_TABLET | Freq: Three times a day (TID) | ORAL | Status: DC | PRN

## 2011-10-08 NOTE — Assessment & Plan Note (Signed)
unchanged

## 2011-10-08 NOTE — Progress Notes (Signed)
Subjective:    Patient ID: Shannon Reese, female    DOB: Jul 28, 1975, 36 y.o.   MRN: [id number]  HPI Patient presents with acute on chronic back pain. 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 She states she had a severe seizure and she locked her arms around her and she believes this is when she hurt her back. Her pain is mostly in the right mid back. She has pain medications that have been prescribed by the emergency room and her oral surgeon recently. She's been taking ibuprofen and BC powder however these have not helped as much. She's also tried the Flexeril however it only made her sleepy.  She has been seen by GYN, s/p colopo Plans to have all teeth removed and dentures placed, treated by dentist with antibiotics a few weeks ago COPD- breathing has been okay, continues to smoke, uses albuterol a few times a week   Review of Systems  GEN- denies fatigue, fever, weight loss,weakness, recent illness HEENT- denies eye drainage, change in vision, nasal discharge, CVS- denies chest pain, palpitations RESP- denies SOB, cough, wheeze ABD- denies N/V, change in stools, abd pain GU- denies dysuria, hematuria, dribbling, incontinence MSK- + joint pain,+ muscle aches, injury Neuro- denies headache, dizziness, syncope, seizure activity      Objective:   Physical Exam GEN- NAD, alert and oriented x3 HEENT- PERRL, EOMI, non injected sclera, pink conjunctiva, MMM, oropharynx clear, poor dentition CVS- RRR, no murmur RESP-CTAB Back- TTP thoracici region, + paraspinal tenderness and spasms in right thoracic palpated, neg SLR Neuro- Strength equal bilat lower ext, sensation grossly in tact EXT- No edema Pulses- Radial 2+        Assessment & Plan:

## 2011-10-08 NOTE — Patient Instructions (Signed)
For your back take the muscle relaxant as needed- avoid during work hours Ibuprofen for pain  Continue all other medications F/U 4 months

## 2011-10-08 NOTE — Assessment & Plan Note (Signed)
Muscle spasm noted on exam. Advised heat, stretching, muscle relaxant. I agreed to give her today off to relax and start medications but she declined. Will start her on soma as she is tried other muscle laxity which have not helped. He'll not give her any narcotic medications. Ibuprofen 800 mg was also prescribed. Note during the exam she was stated that she knew her she could get narcotic medications if needed and she had already been to the emergency room a few times and her dentist to receive pain medications.

## 2011-10-08 NOTE — Assessment & Plan Note (Signed)
Only on albuterol at this time, states she is doing, difficult to tell as focused on back today

## 2011-10-09 ENCOUNTER — Emergency Department (HOSPITAL_COMMUNITY)
Admission: EM | Admit: 2011-10-09 | Discharge: 2011-10-09 | Disposition: A | Discharge status: Home / Self Care | Payer: Medicaid Other | Attending: Emergency Medicine | Admitting: Emergency Medicine

## 2011-10-09 ENCOUNTER — Encounter (HOSPITAL_COMMUNITY): Payer: Self-pay

## 2011-10-09 DIAGNOSIS — J449 Chronic obstructive pulmonary disease, unspecified: Secondary | ICD-10-CM | POA: Insufficient documentation

## 2011-10-09 DIAGNOSIS — Z880 Allergy status to penicillin: Secondary | ICD-10-CM | POA: Insufficient documentation

## 2011-10-09 DIAGNOSIS — Z881 Allergy status to other antibiotic agents status: Secondary | ICD-10-CM | POA: Insufficient documentation

## 2011-10-09 DIAGNOSIS — K219 Gastro-esophageal reflux disease without esophagitis: Secondary | ICD-10-CM | POA: Insufficient documentation

## 2011-10-09 DIAGNOSIS — Z886 Allergy status to analgesic agent status: Secondary | ICD-10-CM | POA: Insufficient documentation

## 2011-10-09 DIAGNOSIS — F411 Generalized anxiety disorder: Secondary | ICD-10-CM | POA: Insufficient documentation

## 2011-10-09 DIAGNOSIS — Z91038 Other insect allergy status: Secondary | ICD-10-CM | POA: Insufficient documentation

## 2011-10-09 DIAGNOSIS — M129 Arthropathy, unspecified: Secondary | ICD-10-CM | POA: Insufficient documentation

## 2011-10-09 DIAGNOSIS — J4489 Other specified chronic obstructive pulmonary disease: Secondary | ICD-10-CM | POA: Insufficient documentation

## 2011-10-09 DIAGNOSIS — F319 Bipolar disorder, unspecified: Secondary | ICD-10-CM | POA: Insufficient documentation

## 2011-10-09 DIAGNOSIS — S335XXA Sprain of ligaments of lumbar spine, initial encounter: Secondary | ICD-10-CM | POA: Insufficient documentation

## 2011-10-09 DIAGNOSIS — X500XXA Overexertion from strenuous movement or load, initial encounter: Secondary | ICD-10-CM | POA: Insufficient documentation

## 2011-10-09 DIAGNOSIS — F17200 Nicotine dependence, unspecified, uncomplicated: Secondary | ICD-10-CM | POA: Insufficient documentation

## 2011-10-09 DIAGNOSIS — T148XXA Other injury of unspecified body region, initial encounter: Secondary | ICD-10-CM

## 2011-10-09 HISTORY — DX: Other chronic pain: G89.29

## 2011-10-09 MED ORDER — METHOCARBAMOL 500 MG PO TABS: ORAL_TABLET | ORAL | Status: DC

## 2011-10-09 MED ORDER — OXYCODONE-ACETAMINOPHEN 5-325 MG PO TABS: 1.0000 | ORAL_TABLET | Freq: Four times a day (QID) | ORAL | Status: AC | PRN

## 2011-10-09 NOTE — ED Notes (Signed)
Pt reports having chronic pain, now having low back, out of her meds, was given soma and ibu, but they are not helping. Usually takes hydrocodone

## 2011-10-11 NOTE — ED Provider Notes (Signed)
History     CSN: [id number]  Arrival date & time 10/09/11  4098   First MD Initiated Contact with Patient 10/09/11 [phone]      Chief Complaint  Patient presents with  . Back Pain    (Consider location/radiation/quality/duration/timing/severity/associated sxs/prior treatment) HPI Comments: Patient c/o increase in her chronic low back pain for several days.  States she has been working and does a lot of lifting and twisting.  Also states she is out of her current medications.  Has been taking motrin and soma , but not helping the pain recently.  She denies numbness or weakness of the LE's, incontinence, urinary sx's, abdominal pain or fever.  States pain is similar to previous episodes.    Patient is a 36 y.o. female presenting with back pain. The history is provided by the patient.  Back Pain  This is a chronic problem. The current episode started 2 days ago. The problem occurs constantly. The problem has been gradually worsening. The pain is associated with lifting heavy objects. The pain is present in the lumbar spine. The quality of the pain is described as aching. The pain does not radiate. The pain is moderate. The symptoms are aggravated by bending, twisting and certain positions. The pain is the same all the time. Pertinent negatives include no chest pain, no fever, no numbness, no abdominal pain, no abdominal swelling, no bowel incontinence, no perianal numbness, no bladder incontinence, no dysuria, no pelvic pain, no leg pain, no paresthesias, no paresis, no tingling and no weakness. She has tried NSAIDs and muscle relaxants for the symptoms. The treatment provided mild relief.    Past Medical History  Diagnosis Date  . Arthritis   . Low back pain   . COPD (chronic obstructive pulmonary disease)   . GERD (gastroesophageal reflux disease)   . Bipolar disorder   . Anxiety   . Facet joint disease MRI 2012  . Chronic pain     Past Surgical History  Procedure Date  . Tubal  ligation     Family History  Problem Relation Age of Onset  . Depression Mother   . Arthritis Mother   . Heart disease Father   . Diabetes Father   . COPD Father   . COPD Sister   . Arthritis Sister   . Alcohol abuse Sister   . Hypertension Brother   . Diabetes Brother     History  Substance Use Topics  . Smoking status: Current Every Day Smoker -- 0.5 packs/day    Types: Cigarettes  . Smokeless tobacco: Not on file  . Alcohol Use: No    OB History    Grav Para Term Preterm Abortions TAB SAB Ect Mult Living                  Review of Systems  Constitutional: Negative for fever.  Respiratory: Negative for shortness of breath.   Cardiovascular: Negative for chest pain.  Gastrointestinal: Negative for vomiting, abdominal pain, constipation and bowel incontinence.  Genitourinary: Negative for bladder incontinence, dysuria, hematuria, flank pain, decreased urine volume, difficulty urinating and pelvic pain.       No perineal numbness or incontinence of urine or feces  Musculoskeletal: Positive for back pain. Negative for joint swelling.  Skin: Negative for rash.  Neurological: Negative for tingling, weakness, numbness and paresthesias.  All other systems reviewed and are negative.    Allergies  Penicillins; Bee venom; Cephalexin; Lansoprazole; and Tramadol  Home Medications   Current Outpatient Rx  Name Route Sig Dispense Refill  . ALBUTEROL 90 MCG/ACT IN AERS Inhalation Inhale 2 puffs into the lungs every 4 (four) hours as needed. For shortness of breath    . ALPRAZOLAM 0.5 MG PO TABS Oral Take 0.5 mg by mouth daily as needed. Anxiety/Sleep    . GOODY HEADACHE PO Oral Take 1-2 packets by mouth as needed. For headaches or pain    . CYCLOBENZAPRINE HCL 10 MG PO TABS Oral Take 1 tablet (10 mg total) by mouth at bedtime as needed for muscle spasms. 30 tablet 2  . IBUPROFEN 800 MG PO TABS Oral Take 1 tablet (800 mg total) by mouth every 8 (eight) hours as needed for  pain. 45 tablet 2  . OMEPRAZOLE 20 MG PO CPDR Oral Take 20 mg by mouth 2 (two) times daily.    Marland Kitchen METHOCARBAMOL 500 MG PO TABS  Take two tablets po TID prn muscle spasms 42 tablet 0  . OXYCODONE-ACETAMINOPHEN 5-325 MG PO TABS Oral Take 1 tablet by mouth every 6 (six) hours as needed for pain. 15 tablet 0    BP 133/59  Pulse 68  Temp 98.3 F (36.8 C) (Oral)  Resp 18  Ht 5\' 9"  (1.753 m)  Wt 225 lb (102.059 kg)  BMI 33.23 kg/m2  SpO2 99%  LMP 10/09/2011  Physical Exam  Nursing note and vitals reviewed. Constitutional: She is oriented to person, place, and time. She appears well-developed and well-nourished. No distress.  HENT:  Head: Normocephalic and atraumatic.  Neck: Normal range of motion. Neck supple.  Cardiovascular: Normal rate, regular rhythm and intact distal pulses.   No murmur heard. Pulmonary/Chest: Effort normal and breath sounds normal.  Musculoskeletal: She exhibits tenderness. She exhibits no edema.       Lumbar back: She exhibits tenderness and pain. She exhibits normal range of motion, no swelling, no deformity, no laceration and normal pulse.       Back:       Diffuse ttp of the lower back.  SLR is positive bilaterally.    Neurological: She is alert and oriented to person, place, and time. No cranial nerve deficit or sensory deficit. She exhibits normal muscle tone. Coordination and gait normal.  Reflex Scores:      Patellar reflexes are 2+ on the right side and 2+ on the left side.      Achilles reflexes are 2+ on the right side and 2+ on the left side. Skin: Skin is warm and dry.    ED Course  Procedures (including critical care time)  Labs Reviewed - No data to display   1. Muscle strain       MDM    Previous ED charts reviewed.  Pt with hx of chronic back pain, pain similar to previous. Patient has ttp of the lumbar paraspinal muscles.  No focal neuro deficits on exam.  Ambulates with a steady gait.     The patient appears reasonably screened  and/or stabilized for discharge and I doubt any other medical condition or other [facility] requiring further screening, evaluation, or treatment in the ED at this time prior to discharge.   Prescribed: Robaxin Percocet #15      Jeter Tomey L. Zygmunt Mcglinn, [place] 10/11/11 1552

## 2011-10-13 NOTE — ED Provider Notes (Signed)
Medical screening examination/treatment/procedure(s) were performed by non-physician practitioner and as supervising physician I was immediately available for consultation/collaboration.   Daesean Lazarz L Marin Milley, MD 10/13/11 1619 

## 2011-11-26 ENCOUNTER — Other Ambulatory Visit: Payer: Self-pay | Admitting: Family Medicine

## 2011-11-28 ENCOUNTER — Other Ambulatory Visit: Payer: Self-pay

## 2011-11-28 MED ORDER — ALPRAZOLAM 0.5 MG PO TABS: 0.5000 mg | ORAL_TABLET | Freq: Every day | ORAL | Status: DC | PRN

## 2011-11-30 ENCOUNTER — Telehealth: Payer: Self-pay

## 2011-11-30 NOTE — Telephone Encounter (Signed)
Yes call into pharmacy, she should have received 90 tablets

## 2011-12-03 ENCOUNTER — Telehealth: Payer: Self-pay | Admitting: Family Medicine

## 2011-12-03 NOTE — Telephone Encounter (Signed)
Toni Amend called in the correct med and was unable to reach pt by phone

## 2011-12-03 NOTE — Telephone Encounter (Signed)
Called pharmacy and corrected rx.  Changed to TID #60 remaining to pickup.

## 2011-12-24 ENCOUNTER — Encounter: Payer: Self-pay | Admitting: Family Medicine

## 2011-12-24 ENCOUNTER — Ambulatory Visit (INDEPENDENT_AMBULATORY_CARE_PROVIDER_SITE_OTHER): Payer: Medicaid Other | Admitting: Family Medicine

## 2011-12-24 VITALS — BP 104/68 | HR 82 | Resp 18 | Ht 68.0 in | Wt 226.1 lb

## 2011-12-24 DIAGNOSIS — F1110 Opioid abuse, uncomplicated: Secondary | ICD-10-CM | POA: Insufficient documentation

## 2011-12-24 DIAGNOSIS — F1310 Sedative, hypnotic or anxiolytic abuse, uncomplicated: Secondary | ICD-10-CM

## 2011-12-24 NOTE — Assessment & Plan Note (Signed)
Very difficult situation, I spent 15 minutes on phone calling local psych and pain clinics to see if they do detox outpatient and was unable to find anyone. She declines going to ER for inpatient detox because of her home situation. She was given the [facility] number to call and make an appt for detox assessment. She agreed to do this, but I have no way to follow her up as all phones have been disconnect. I did discuss case with psychiatrist at [facility] and they advised too much risk to detox outpatient through our office with meds such as clonidine, as I do not feel comfortable doing so, no meds given at visit, hopefully she will follow- through

## 2011-12-24 NOTE — Patient Instructions (Addendum)
Your options are to walk into the ED for detox Or  Call [facility] [phone] for appointment for detox I will call to check  On your progress Schedule a physical exam in 3 months for PAP Smear

## 2011-12-24 NOTE — Progress Notes (Signed)
Subjective:    Patient ID: Shannon Reese, female    DOB: 1975-12-09, 36 y.o.   MRN: [id number]  HPI Patient presents in my office today for options for her drug detox. She states plainly that she is an addict and that she has been upwards of $1000 a week on her addiction. She typically buys oxycontin, and hydrocodone, oxycodone off the streets. She has done cocaine twice but denies IV drug abuse. She states that she is unable to afford her drug addiction anymore and wants to change. She's stopped using the narcotic pills and started methadone 8 mg strips one week ago because she thought this would help her get off of the drugs. She has lost her cell phone house phone but still has her home that she is renting where she resides with her children and her mother. She's also still working. She denies any alcohol use. She does take her been says one to 2 at bedtime as prescribed to help her nerves. She's afraid of going into withdrawal. Because of her home situation she has no one to rely and to help watch her mother or her children therefore she does not want to go to the emergency room for inpatient detox and this is why she has not done this up to this date.  Review of Systems - per above     Objective:   Physical Exam  GEN-NAD, alert and oriented x 3 Psych- depressed affect, crying, not anxious appearing, no SI, no hallucinations Neuro- normal speech, thought process, no tremor      Assessment & Plan:

## 2012-01-03 ENCOUNTER — Other Ambulatory Visit: Payer: Self-pay | Admitting: Family Medicine

## 2012-01-04 ENCOUNTER — Telehealth: Payer: Self-pay | Admitting: Family Medicine

## 2012-01-04 NOTE — Telephone Encounter (Signed)
Ok to fax in.

## 2012-01-07 ENCOUNTER — Encounter: Payer: Self-pay | Admitting: Family Medicine

## 2012-01-07 ENCOUNTER — Ambulatory Visit (INDEPENDENT_AMBULATORY_CARE_PROVIDER_SITE_OTHER): Payer: Medicaid Other | Admitting: Family Medicine

## 2012-01-07 ENCOUNTER — Other Ambulatory Visit: Payer: Self-pay | Admitting: Family Medicine

## 2012-01-07 VITALS — BP 116/72 | HR 85 | Resp 18 | Ht 68.0 in | Wt 214.1 lb

## 2012-01-07 DIAGNOSIS — Z79899 Other long term (current) drug therapy: Secondary | ICD-10-CM

## 2012-01-07 DIAGNOSIS — F3289 Other specified depressive episodes: Secondary | ICD-10-CM

## 2012-01-07 DIAGNOSIS — F32A Depression, unspecified: Secondary | ICD-10-CM

## 2012-01-07 DIAGNOSIS — F411 Generalized anxiety disorder: Secondary | ICD-10-CM

## 2012-01-07 DIAGNOSIS — F419 Anxiety disorder, unspecified: Secondary | ICD-10-CM

## 2012-01-07 DIAGNOSIS — F329 Major depressive disorder, single episode, unspecified: Secondary | ICD-10-CM

## 2012-01-07 MED ORDER — ALPRAZOLAM 0.5 MG PO TABS: 0.5000 mg | ORAL_TABLET | Freq: Every day | ORAL | Status: DC | PRN

## 2012-01-07 MED ORDER — CITALOPRAM HYDROBROMIDE 10 MG PO TABS: 10.0000 mg | ORAL_TABLET | Freq: Every day | ORAL | Status: DC

## 2012-01-07 NOTE — Patient Instructions (Signed)
Start Celexa 10mg  Continue xanax at bedtime Walk into [facility] for counseling services F/U 4 weeks for medications No refills on xanax unless you come in for appointments

## 2012-01-07 NOTE — Assessment & Plan Note (Signed)
Xanax refills urine drug screen obtained I expected to be positive for opiates as well as marijuana and possible Valium. She understands she will not continue get medications from me if her drug screen do not clear up. Willing to give her a chance to work with her during this time in her life hopefully she will improve
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
## 2012-01-07 NOTE — Progress Notes (Signed)
Subjective:    Patient ID: Shannon Reese, female    DOB: 12-14-1975, 36 y.o.   MRN: [id number]  HPI  Patient here to followup medications. She called in for refill on her Xanax and was brought in for visit. After her last visit which she states that she was addicted to pain medication she did not seek care recall the numbers that I provided for her for detox. She states she's not had any narcotic medication for the past 2 weeks. She does state that she did use marijuana within the past 30 days. She also takes soma and Valium in the past 30 days. She is working 40 hours a week he wants to get better. She wants to go back on her antidepressant she has been on many in the past but Celexa work well for her she states she is ready to start a new life or her children. She is unable to find time to go to counseling or therapy with her job in caring for her children and her mother.  Review of Systems - per above  GEN- denies fatigue, fever, weight loss,weakness, recent illness HEENT- denies eye drainage, change in vision, nasal discharge, CVS- denies chest pain, palpitations RESP- denies SOB, cough, wheeze Neuro- denies headache, dizziness, syncope, seizure activity Psych-+ stressed      Objective:   Physical Exam  GEN-NAD, alert and oriented x 3 PSYCH- fair eye contact, not overly depressed, stressed and anxious appearing, no shakes or abnormal movements, no SI      Assessment & Plan:

## 2012-01-07 NOTE — Telephone Encounter (Signed)
You may refill °

## 2012-01-14 ENCOUNTER — Telehealth: Payer: Self-pay | Admitting: Family Medicine

## 2012-01-17 ENCOUNTER — Telehealth: Payer: Self-pay

## 2012-01-17 ENCOUNTER — Other Ambulatory Visit: Payer: Self-pay | Admitting: Family Medicine

## 2012-01-17 MED ORDER — ALPRAZOLAM 0.5 MG PO TABS: ORAL_TABLET | ORAL | Status: DC

## 2012-01-17 NOTE — Telephone Encounter (Signed)
rx sent

## 2012-01-17 NOTE — Telephone Encounter (Signed)
Spoke with patient.   See next telephone message from 1/9

## 2012-01-17 NOTE — Telephone Encounter (Signed)
1 week supply with same directions printed. pls fax and let her know. I will also forward to her PCP to continue manaagement

## 2012-01-21 ENCOUNTER — Telehealth: Payer: Self-pay | Admitting: Family Medicine

## 2012-01-21 MED ORDER — ALPRAZOLAM 0.5 MG PO TABS: 0.5000 mg | ORAL_TABLET | Freq: Three times a day (TID) | ORAL | Status: DC | PRN
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
## 2012-01-21 NOTE — Addendum Note (Signed)
Addended by: Milinda Antis F on: 01/21/2012 09:38 AM   Modules accepted: Orders

## 2012-01-21 NOTE — Telephone Encounter (Signed)
Spoke with pharmacist, dose is suppose to be TID  I have corrected this, new script called in, please let pt know

## 2012-01-21 NOTE — Telephone Encounter (Signed)
Noted  

## 2012-02-07 ENCOUNTER — Emergency Department (HOSPITAL_COMMUNITY)
Admission: EM | Admit: 2012-02-07 | Discharge: 2012-02-07 | Disposition: A | Discharge status: Home / Self Care | Payer: Medicaid Other | Attending: Emergency Medicine | Admitting: Emergency Medicine

## 2012-02-07 ENCOUNTER — Encounter (HOSPITAL_COMMUNITY): Payer: Self-pay

## 2012-02-07 DIAGNOSIS — J4489 Other specified chronic obstructive pulmonary disease: Secondary | ICD-10-CM | POA: Insufficient documentation

## 2012-02-07 DIAGNOSIS — Z8739 Personal history of other diseases of the musculoskeletal system and connective tissue: Secondary | ICD-10-CM | POA: Insufficient documentation

## 2012-02-07 DIAGNOSIS — Y9289 Other specified places as the place of occurrence of the external cause: Secondary | ICD-10-CM | POA: Insufficient documentation

## 2012-02-07 DIAGNOSIS — F319 Bipolar disorder, unspecified: Secondary | ICD-10-CM | POA: Insufficient documentation

## 2012-02-07 DIAGNOSIS — F1110 Opioid abuse, uncomplicated: Secondary | ICD-10-CM | POA: Insufficient documentation

## 2012-02-07 DIAGNOSIS — Y9389 Activity, other specified: Secondary | ICD-10-CM | POA: Insufficient documentation

## 2012-02-07 DIAGNOSIS — F411 Generalized anxiety disorder: Secondary | ICD-10-CM | POA: Insufficient documentation

## 2012-02-07 DIAGNOSIS — IMO0002 Reserved for concepts with insufficient information to code with codable children: Secondary | ICD-10-CM | POA: Insufficient documentation

## 2012-02-07 DIAGNOSIS — Z79899 Other long term (current) drug therapy: Secondary | ICD-10-CM | POA: Insufficient documentation

## 2012-02-07 DIAGNOSIS — T148XXA Other injury of unspecified body region, initial encounter: Secondary | ICD-10-CM

## 2012-02-07 DIAGNOSIS — F17200 Nicotine dependence, unspecified, uncomplicated: Secondary | ICD-10-CM | POA: Insufficient documentation

## 2012-02-07 DIAGNOSIS — J449 Chronic obstructive pulmonary disease, unspecified: Secondary | ICD-10-CM | POA: Insufficient documentation

## 2012-02-07 DIAGNOSIS — K219 Gastro-esophageal reflux disease without esophagitis: Secondary | ICD-10-CM | POA: Insufficient documentation

## 2012-02-07 MED ORDER — OXYCODONE-ACETAMINOPHEN 5-325 MG PO TABS: 1.0000 | ORAL_TABLET | ORAL | Status: DC | PRN

## 2012-02-07 MED ORDER — OXYCODONE-ACETAMINOPHEN 5-325 MG PO TABS
1.0000 | ORAL_TABLET | Freq: Once | ORAL | Status: AC
  Administered 2012-02-07: 1 via ORAL
  Filled 2012-02-07: qty 1

## 2012-02-07 NOTE — ED Notes (Signed)
Pt slipped and fell yesterday, cont. To have left side upper back pain. Denies any loc. Pain is between shoulder and spine.

## 2012-02-07 NOTE — ED Notes (Signed)
Pt presents to ED secondary to left back/and rib pain after a fall. Pt denies LOC and other injuries at this time. NAD noted denies SOB.

## 2012-02-07 NOTE — ED Notes (Signed)
MD at bedside. Dr Bebe Shaggy at bedside discussing plan of care and examining pt.

## 2012-02-07 NOTE — ED Provider Notes (Signed)
History     CSN: [id number]  Arrival date & time 02/07/12  1256   First MD Initiated Contact with Patient 02/07/12 1306      Chief Complaint  Patient presents with  . Fall     Patient is a 37 y.o. female presenting with fall. The history is provided by the patient.  Fall The accident occurred yesterday. Incident: while assisting her patient. Point of impact: left back. Pain location: left upper back. The pain is moderate. She was ambulatory at the scene. There was no entrapment after the fall. Pertinent negatives include no fever, no numbness, no abdominal pain, no bowel incontinence, no vomiting, no headaches and no loss of consciousness. The symptoms are aggravated by activity. She has tried rest and NSAIDs for the symptoms. The treatment provided no relief.  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  The patient landed on her back.  No head or neck injury.  No LOC.  No sob.  No abd pain.  No leg weakness.  No urinary/fecal incontinence reported.    Today the pain is worse in her left upper back.    Past Medical History  Diagnosis Date  . Arthritis   . Low back pain   . COPD (chronic obstructive pulmonary disease)   . GERD (gastroesophageal reflux disease)   . Bipolar disorder   . Anxiety   . Facet joint disease MRI 2012  . Chronic pain   . Narcotic abuse     Drug seeking behavior    Past Surgical History  Procedure Date  . Tubal ligation     Family History  Problem Relation Age of Onset  . Depression Mother   . Arthritis Mother   . Heart disease Father   . Diabetes Father   . COPD Father   . COPD Sister   . Arthritis Sister   . Alcohol abuse Sister   . Hypertension Brother   . Diabetes Brother     History  Substance Use Topics  . Smoking status: Current Every Day Smoker -- 0.5 packs/day    Types: Cigarettes  . Smokeless tobacco: Not on file  . Alcohol Use: No    OB History     Grav Para Term Preterm Abortions TAB SAB Ect Mult Living                  Review of Systems  Constitutional: Negative for fever and fatigue.  Respiratory: Negative for shortness of breath.        Neg for hemoptysis   Cardiovascular: Negative for chest pain.  Gastrointestinal: Negative for vomiting, abdominal pain and bowel incontinence.  Musculoskeletal: Positive for back pain.  Neurological: Negative for loss of consciousness, weakness, numbness and headaches.  Psychiatric/Behavioral: Negative for agitation.  All other systems reviewed and are negative.    Allergies  Penicillins; Bee venom; Cephalexin; Lansoprazole; and Tramadol  Home Medications   Current Outpatient Rx  Name  Route  Sig  Dispense  Refill  . ALBUTEROL 90 MCG/ACT IN AERS   Inhalation   Inhale 2 puffs into the lungs every 4 (four) hours as needed. For shortness of breath         . ALPRAZOLAM 0.5 MG PO TABS   Oral   Take 1 tablet (0.5 mg total) by mouth 3 (three) times daily as needed. Anxiety/Sleep   90 tablet   1   . CITALOPRAM HYDROBROMIDE  10 MG PO TABS   Oral   Take 1 tablet (10 mg total) by mouth daily.   30 tablet   2   . IBUPROFEN 800 MG PO TABS   Oral   Take 1 tablet (800 mg total) by mouth every 8 (eight) hours as needed for pain.   45 tablet   2   . OXYCODONE-ACETAMINOPHEN 5-325 MG PO TABS   Oral   Take 1 tablet by mouth every 4 (four) hours as needed for pain.   5 tablet   0   . PRILOSEC 20 MG PO CPDR      TAKE (1) CAPSULE BY MOUTH TWICE DAILY.   60 capsule   2     BP 128/78  Pulse 90  Temp 98.1 F (36.7 C) (Oral)  Resp 20  Ht 5\' 10"  (1.778 m)  Wt 217 lb (98.431 kg)  BMI 31.14 kg/m2  SpO2 100%  LMP 01/23/2012  Physical Exam CONSTITUTIONAL: Well developed/well nourished HEAD AND FACE: Normocephalic/atraumatic EYES: EOMI/PERRL ENMT: Mucous membranes moist, No evidence of facial/nasal trauma NECK: supple no meningeal signs SPINE:entire spine nontender Left  thoracic paraspinal tenderness and tenderness over left scapula.  No bruising/crepitance/stepoffs noted to spine No crepitance/bruising noted to paraspinal/scapular region CV: S1/S2 noted, no murmurs/rubs/gallops noted LUNGS: Lungs are clear to auscultation bilaterally, no apparent distress ABDOMEN: soft, nontender, no rebound or guarding GU:no cva tenderness NEURO: Pt is awake/alert, moves all extremitiesx4. No ataxia.  No focal motor weakness noted in her extremities EXTREMITIES: pulses normal, full ROM.  All extremities/joints palpated/ranged and nontender SKIN: warm, color normal PSYCH: no abnormalities of mood noted  ED Course  Procedures   1. Muscle strain    No signs of acute chest injury or acute spinal injury.  Advised NSAIDS (stop goody powder) We discussed strict return precautions including weakness, SOB/hemoptysis/chest pain/dyspnea on exertion   MDM  Nursing notes including past medical history and social history reviewed and considered in documentation Narcotic database reviewed        Joya Gaskins, MD 02/07/12 1341

## 2012-03-05 ENCOUNTER — Encounter (HOSPITAL_COMMUNITY): Payer: Self-pay | Admitting: Emergency Medicine

## 2012-03-05 ENCOUNTER — Emergency Department (HOSPITAL_COMMUNITY)
Admission: EM | Admit: 2012-03-05 | Discharge: 2012-03-05 | Disposition: A | Discharge status: Home / Self Care | Payer: Medicaid Other | Attending: Emergency Medicine | Admitting: Emergency Medicine

## 2012-03-05 DIAGNOSIS — F319 Bipolar disorder, unspecified: Secondary | ICD-10-CM | POA: Insufficient documentation

## 2012-03-05 DIAGNOSIS — F1310 Sedative, hypnotic or anxiolytic abuse, uncomplicated: Secondary | ICD-10-CM | POA: Insufficient documentation

## 2012-03-05 DIAGNOSIS — R51 Headache: Secondary | ICD-10-CM | POA: Insufficient documentation

## 2012-03-05 DIAGNOSIS — F17200 Nicotine dependence, unspecified, uncomplicated: Secondary | ICD-10-CM | POA: Insufficient documentation

## 2012-03-05 DIAGNOSIS — K029 Dental caries, unspecified: Secondary | ICD-10-CM | POA: Insufficient documentation

## 2012-03-05 DIAGNOSIS — J449 Chronic obstructive pulmonary disease, unspecified: Secondary | ICD-10-CM | POA: Insufficient documentation

## 2012-03-05 DIAGNOSIS — K089 Disorder of teeth and supporting structures, unspecified: Secondary | ICD-10-CM | POA: Insufficient documentation

## 2012-03-05 DIAGNOSIS — J4489 Other specified chronic obstructive pulmonary disease: Secondary | ICD-10-CM | POA: Insufficient documentation

## 2012-03-05 DIAGNOSIS — Z79899 Other long term (current) drug therapy: Secondary | ICD-10-CM | POA: Insufficient documentation

## 2012-03-05 DIAGNOSIS — Z8719 Personal history of other diseases of the digestive system: Secondary | ICD-10-CM | POA: Insufficient documentation

## 2012-03-05 DIAGNOSIS — Z8739 Personal history of other diseases of the musculoskeletal system and connective tissue: Secondary | ICD-10-CM | POA: Insufficient documentation

## 2012-03-05 DIAGNOSIS — F411 Generalized anxiety disorder: Secondary | ICD-10-CM | POA: Insufficient documentation

## 2012-03-05 MED ORDER — CLINDAMYCIN HCL 150 MG PO CAPS: 150.0000 mg | ORAL_CAPSULE | Freq: Four times a day (QID) | ORAL | Status: DC

## 2012-03-05 MED ORDER — HYDROCODONE-ACETAMINOPHEN 5-325 MG PO TABS: 1.0000 | ORAL_TABLET | ORAL | Status: DC | PRN

## 2012-03-05 NOTE — ED Notes (Signed)
Pt c/o dental pain. Pt states she had an appointment to have all of her teeth removed but the surgery was rescheduled due to bad weather.

## 2012-03-05 NOTE — ED Provider Notes (Signed)
History     CSN: [id number]  Arrival date & time 03/05/12  1821   None     Chief Complaint  Patient presents with  . Dental Pain    (HPI Shannon Reese is a 37 y.o. female who presents to the ED with dental pain. The pain is located in her upper front teeth.This has been a recurrent problem for several months. She had an appointment to have all her teeth removed and get dentures but appointment got cancelled during the snow. She is rescheduled for 03/14/12. This episode of pain has been going on for 2 weeks. She has been taking ibuprofen and goody powder without relief. The history was provided by the patient.  Past Medical History  Diagnosis Date  . Arthritis   . Low back pain   . COPD (chronic obstructive pulmonary disease)   . GERD (gastroesophageal reflux disease)   . Bipolar disorder   . Anxiety   . Facet joint disease MRI 2012  . Chronic pain   . Narcotic abuse     Drug seeking behavior    Past Surgical History  Procedure Laterality Date  . Tubal ligation      Family History  Problem Relation Age of Onset  . Depression Mother   . Arthritis Mother   . Heart disease Father   . Diabetes Father   . COPD Father   . COPD Sister   . Arthritis Sister   . Alcohol abuse Sister   . Hypertension Brother   . Diabetes Brother     History  Substance Use Topics  . Smoking status: Current Every Day Smoker -- 0.50 packs/day    Types: Cigarettes  . Smokeless tobacco: Not on file  . Alcohol Use: No    OB History   Grav Para Term Preterm Abortions TAB SAB Ect Mult Living                  Review of Systems  Constitutional: Negative for fever, chills, activity change and appetite change.  HENT: Positive for dental problem. Negative for facial swelling and neck pain.   Eyes: Negative for visual disturbance.  Respiratory: Negative for cough and chest tightness.   Cardiovascular: Negative for chest pain.  Gastrointestinal: Abdominal pain: GERD.  Genitourinary:  Negative for dysuria.  Musculoskeletal: Negative for back pain.  Skin: Negative for wound.  Allergic/Immunologic: Negative for immunocompromised state.  Neurological: Positive for headaches. Negative for light-headedness.  Psychiatric/Behavioral: Negative for confusion and agitation.    Allergies  Penicillins; Bee venom; Cephalexin; Lansoprazole; and Tramadol  Home Medications   Current Outpatient Rx  Name  Route  Sig  Dispense  Refill  . albuterol (PROVENTIL,VENTOLIN) 90 MCG/ACT inhaler   Inhalation   Inhale 2 puffs into the lungs every 4 (four) hours as needed. For shortness of breath         . citalopram (CELEXA) 10 MG tablet   Oral   Take 1 tablet (10 mg total) by mouth daily.   30 tablet   2   . ALPRAZolam (XANAX) 0.5 MG tablet   Oral   Take 1 tablet (0.5 mg total) by mouth 3 (three) times daily as needed. Anxiety/Sleep   90 tablet   1     BP 121/83  Pulse 91  Temp(Src) 98 F (36.7 C) (Oral)  Resp 18  SpO2 100%  LMP 01/23/2012  Physical Exam  Nursing note and vitals reviewed. Constitutional: She is oriented to person, place, and time. She appears  well-developed and well-nourished.  HENT:  Head: Normocephalic and atraumatic.  Nose: Nose normal.  Mouth/Throat: Uvula is midline, oropharynx is clear and moist and mucous membranes are normal. Dental caries present.    Eyes: EOM are normal. Pupils are equal, round, and reactive to light.  Neck: Neck supple.  Cardiovascular: Normal rate.   Pulmonary/Chest: Effort normal.  Abdominal: Soft. There is no tenderness.  Musculoskeletal: Normal range of motion. She exhibits no edema.  Neurological: She is alert and oriented to person, place, and time. No cranial nerve deficit.  Skin: Skin is warm and dry.  Psychiatric: She has a normal mood and affect. Her behavior is normal. Judgment and thought content normal.   Procedures   Assessment: 37 y.o. female with dental pain   Dental caries   Chronic  pan  Plan:  Pain management   Stop Goody powder   Keep appointment 03/14/12 with dentist    Discussed with the patient and all questioned fully answered.    Medication List    TAKE these medications       clindamycin 150 MG capsule  Commonly known as:  CLEOCIN  Take 1 capsule (150 mg total) by mouth every 6 (six) hours.     HYDROcodone-acetaminophen 5-325 MG per tablet  Commonly known as:  NORCO/VICODIN  Take 1 tablet by mouth every 4 (four) hours as needed for pain.      ASK your doctor about these medications       albuterol 90 MCG/ACT inhaler  Commonly known as:  PROVENTIL,VENTOLIN  Inhale 2 puffs into the lungs every 4 (four) hours as needed. For shortness of breath     ALPRAZolam 0.5 MG tablet  Commonly known as:  XANAX  Take 1 tablet (0.5 mg total) by mouth 3 (three) times daily as needed. Anxiety/Sleep     citalopram 10 MG tablet  Commonly known as:  CELEXA  Take 1 tablet (10 mg total) by mouth daily.            [place], [place] 03/05/12 [phone]

## 2012-03-05 NOTE — ED Provider Notes (Signed)
Medical screening examination/treatment/procedure(s) were performed by non-physician practitioner and as supervising physician I was immediately available for consultation/collaboration.   Lejend Dalby Y. Jessey Stehlin, MD 03/05/12 2122 

## 2012-03-17 ENCOUNTER — Telehealth: Payer: Self-pay | Admitting: Family Medicine

## 2012-03-17 MED ORDER — ALPRAZOLAM 0.5 MG PO TABS: 0.5000 mg | ORAL_TABLET | Freq: Three times a day (TID) | ORAL | Status: DC | PRN

## 2012-03-17 NOTE — Telephone Encounter (Signed)
Okay to refill, please let pt know this is 1 month fill only, She must come in for an appointment before any further medications

## 2012-03-17 NOTE — Telephone Encounter (Signed)
Is this ok?

## 2012-03-17 NOTE — Telephone Encounter (Signed)
Will be sent today

## 2012-03-18 ENCOUNTER — Telehealth: Payer: Self-pay | Admitting: Family Medicine

## 2012-03-18 MED ORDER — ALPRAZOLAM 0.5 MG PO TABS: 0.5000 mg | ORAL_TABLET | Freq: Three times a day (TID) | ORAL | Status: DC | PRN

## 2012-03-18 NOTE — Telephone Encounter (Signed)
Printed to send to [facility]

## 2012-03-27 ENCOUNTER — Emergency Department (HOSPITAL_COMMUNITY)
Admission: EM | Admit: 2012-03-27 | Discharge: 2012-03-27 | Disposition: A | Discharge status: Home / Self Care | Payer: Medicaid Other | Attending: Emergency Medicine | Admitting: Emergency Medicine

## 2012-03-27 ENCOUNTER — Encounter (HOSPITAL_COMMUNITY): Payer: Self-pay | Admitting: *Deleted

## 2012-03-27 ENCOUNTER — Emergency Department (HOSPITAL_COMMUNITY): Payer: Medicaid Other

## 2012-03-27 DIAGNOSIS — G8929 Other chronic pain: Secondary | ICD-10-CM | POA: Insufficient documentation

## 2012-03-27 DIAGNOSIS — Z79899 Other long term (current) drug therapy: Secondary | ICD-10-CM | POA: Insufficient documentation

## 2012-03-27 DIAGNOSIS — S20212A Contusion of left front wall of thorax, initial encounter: Secondary | ICD-10-CM

## 2012-03-27 DIAGNOSIS — IMO0002 Reserved for concepts with insufficient information to code with codable children: Secondary | ICD-10-CM | POA: Insufficient documentation

## 2012-03-27 DIAGNOSIS — W2209XA Striking against other stationary object, initial encounter: Secondary | ICD-10-CM | POA: Insufficient documentation

## 2012-03-27 DIAGNOSIS — J4489 Other specified chronic obstructive pulmonary disease: Secondary | ICD-10-CM | POA: Insufficient documentation

## 2012-03-27 DIAGNOSIS — Z8719 Personal history of other diseases of the digestive system: Secondary | ICD-10-CM | POA: Insufficient documentation

## 2012-03-27 DIAGNOSIS — S20219A Contusion of unspecified front wall of thorax, initial encounter: Secondary | ICD-10-CM | POA: Insufficient documentation

## 2012-03-27 DIAGNOSIS — Y9289 Other specified places as the place of occurrence of the external cause: Secondary | ICD-10-CM | POA: Insufficient documentation

## 2012-03-27 DIAGNOSIS — J449 Chronic obstructive pulmonary disease, unspecified: Secondary | ICD-10-CM | POA: Insufficient documentation

## 2012-03-27 DIAGNOSIS — F411 Generalized anxiety disorder: Secondary | ICD-10-CM | POA: Insufficient documentation

## 2012-03-27 DIAGNOSIS — Z8739 Personal history of other diseases of the musculoskeletal system and connective tissue: Secondary | ICD-10-CM | POA: Insufficient documentation

## 2012-03-27 DIAGNOSIS — Y9389 Activity, other specified: Secondary | ICD-10-CM | POA: Insufficient documentation

## 2012-03-27 DIAGNOSIS — F17200 Nicotine dependence, unspecified, uncomplicated: Secondary | ICD-10-CM | POA: Insufficient documentation

## 2012-03-27 DIAGNOSIS — F319 Bipolar disorder, unspecified: Secondary | ICD-10-CM | POA: Insufficient documentation

## 2012-03-27 MED ORDER — HYDROCODONE-ACETAMINOPHEN 5-325 MG PO TABS: 1.0000 | ORAL_TABLET | ORAL | Status: DC | PRN

## 2012-03-27 MED ORDER — HYDROCODONE-ACETAMINOPHEN 5-325 MG PO TABS
1.0000 | ORAL_TABLET | Freq: Once | ORAL | Status: AC
  Administered 2012-03-27: 1 via ORAL
  Filled 2012-03-27: qty 1
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
## 2012-03-27 NOTE — ED Provider Notes (Signed)
Medical screening examination/treatment/procedure(s) were performed by non-physician practitioner and as supervising physician I was immediately available for consultation/collaboration.  Gilda Crease, MD 03/27/12 [phone]

## 2012-04-17 ENCOUNTER — Telehealth: Payer: Self-pay | Admitting: Family Medicine

## 2012-04-17 ENCOUNTER — Encounter: Payer: Self-pay | Admitting: Family Medicine

## 2012-04-17 ENCOUNTER — Ambulatory Visit (INDEPENDENT_AMBULATORY_CARE_PROVIDER_SITE_OTHER): Payer: Medicaid Other | Admitting: Family Medicine

## 2012-04-17 VITALS — BP 112/72 | HR 80 | Resp 18 | Ht 68.0 in | Wt 217.0 lb

## 2012-04-17 DIAGNOSIS — L97519 Non-pressure chronic ulcer of other part of right foot with unspecified severity: Secondary | ICD-10-CM

## 2012-04-17 DIAGNOSIS — Z79899 Other long term (current) drug therapy: Secondary | ICD-10-CM

## 2012-04-17 DIAGNOSIS — F319 Bipolar disorder, unspecified: Secondary | ICD-10-CM

## 2012-04-17 DIAGNOSIS — F411 Generalized anxiety disorder: Secondary | ICD-10-CM

## 2012-04-17 DIAGNOSIS — L97509 Non-pressure chronic ulcer of other part of unspecified foot with unspecified severity: Secondary | ICD-10-CM

## 2012-04-17 DIAGNOSIS — F419 Anxiety disorder, unspecified: Secondary | ICD-10-CM

## 2012-04-17 MED ORDER — ALPRAZOLAM 0.5 MG PO TABS: 0.5000 mg | ORAL_TABLET | Freq: Three times a day (TID) | ORAL | Status: DC | PRN

## 2012-04-17 MED ORDER — CITALOPRAM HYDROBROMIDE 20 MG PO TABS: 20.0000 mg | ORAL_TABLET | Freq: Every day | ORAL | Status: DC
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
## 2012-04-17 NOTE — Assessment & Plan Note (Signed)
Referral to podiatry  

## 2012-04-17 NOTE — Assessment & Plan Note (Signed)
Increase celexa to 20mg , continue xanax UDS to be done, to see if pt compliant

## 2012-04-17 NOTE — Telephone Encounter (Signed)
Patient is aware 

## 2012-04-17 NOTE — Assessment & Plan Note (Signed)
No current mood stabilizers, has been on many meds, appears to be upbeat and doing okay on celexa

## 2012-04-17 NOTE — Patient Instructions (Signed)
Celexa increased to 20mg  Xanax refilled Continue to think and act positive! F/U 3 months

## 2012-04-18 LAB — PRESCRIPTION ABUSE MONITORING 15P, URINE
Barbiturate Screen, Urine: NEGATIVE ng/mL
Buprenorphine, Urine: NEGATIVE ng/mL
Cocaine Metabolites: NEGATIVE ng/mL
Creatinine, Urine: 236.25 mg/dL (ref >20.0)
Meperidine, Ur: NEGATIVE ng/mL
Methadone Screen, Urine: NEGATIVE ng/mL
Tramadol Scrn, Ur: NEGATIVE ng/mL
Zolpidem, Urine: NEGATIVE ng/mL

## 2012-04-21 LAB — BENZODIAZEPINES (GC/LC/MS), URINE
Alprazolam (GC/LC/MS), ur confirm: 179 ng/mL
Alprazolam metabolite (GC/LC/MS), ur confirm: 175 ng/mL
Clonazepam metabolite (GC/LC/MS), ur confirm: 366 ng/mL
Diazepam (GC/LC/MS), ur confirm: NEGATIVE ng/mL
Flunitrazepam metabolite (GC/LC/MS), ur confirm: NEGATIVE ng/mL
Flurazepam metabolite (GC/LC/MS), ur confirm: NEGATIVE ng/mL
Lorazepam (GC/LC/MS), ur confirm: NEGATIVE ng/mL
Triazolam metabolite (GC/LC/MS), ur confirm: NEGATIVE ng/mL

## 2012-04-21 LAB — OPIATES/OPIOIDS (LC/MS-MS)
Heroin (6-AM), UR: NEGATIVE ng/mL
Norhydrocodone, Ur: >2500 ng/mL
Noroxycodone, Ur: NEGATIVE ng/mL
Oxycodone, ur: NEGATIVE ng/mL
Oxymorphone: 68 ng/mL

## 2012-04-22 LAB — CANNABANOIDS (GC/LC/MS), URINE: THC-COOH (GC/LC/MS), ur confirm: 154 ng/mL

## 2012-05-06 ENCOUNTER — Encounter: Payer: Self-pay | Admitting: *Deleted

## 2012-05-14 ENCOUNTER — Telehealth: Payer: Self-pay | Admitting: Family Medicine

## 2012-05-14 NOTE — Telephone Encounter (Signed)
Copy printed for patient

## 2012-06-03 ENCOUNTER — Encounter: Payer: Self-pay | Admitting: Family Medicine

## 2012-06-11 ENCOUNTER — Telehealth: Payer: Self-pay | Admitting: Family Medicine

## 2012-06-11 NOTE — Telephone Encounter (Signed)
Okay to fill? 

## 2012-06-11 NOTE — Telephone Encounter (Signed)
?  ok to refill °

## 2012-06-12 MED ORDER — ALPRAZOLAM 0.5 MG PO TABS: 0.5000 mg | ORAL_TABLET | Freq: Three times a day (TID) | ORAL | Status: DC | PRN

## 2012-06-12 NOTE — Telephone Encounter (Signed)
Script phoned in

## 2012-07-09 ENCOUNTER — Other Ambulatory Visit: Payer: Self-pay | Admitting: Family Medicine

## 2012-07-09 NOTE — Telephone Encounter (Signed)
yes

## 2012-07-09 NOTE — Telephone Encounter (Signed)
?  ok to refill °

## 2012-07-09 NOTE — Telephone Encounter (Signed)
Med refilled.

## 2012-07-16 ENCOUNTER — Ambulatory Visit: Payer: Self-pay | Admitting: Family Medicine

## 2012-07-17 ENCOUNTER — Ambulatory Visit: Payer: Medicaid Other | Admitting: Family Medicine

## 2012-07-23 ENCOUNTER — Ambulatory Visit: Payer: Self-pay | Admitting: Family Medicine

## 2012-07-27 ENCOUNTER — Emergency Department (HOSPITAL_COMMUNITY)
Admission: EM | Admit: 2012-07-27 | Discharge: 2012-07-27 | Discharge status: Against Medical Advice | Payer: Medicaid Other | Attending: Emergency Medicine | Admitting: Emergency Medicine

## 2012-07-27 ENCOUNTER — Encounter (HOSPITAL_COMMUNITY): Payer: Self-pay | Admitting: Emergency Medicine

## 2012-07-27 DIAGNOSIS — N949 Unspecified condition associated with female genital organs and menstrual cycle: Secondary | ICD-10-CM | POA: Insufficient documentation

## 2012-07-27 DIAGNOSIS — F17200 Nicotine dependence, unspecified, uncomplicated: Secondary | ICD-10-CM | POA: Insufficient documentation

## 2012-07-27 DIAGNOSIS — R109 Unspecified abdominal pain: Secondary | ICD-10-CM | POA: Insufficient documentation

## 2012-07-27 DIAGNOSIS — N938 Other specified abnormal uterine and vaginal bleeding: Secondary | ICD-10-CM | POA: Insufficient documentation

## 2012-07-27 DIAGNOSIS — N939 Abnormal uterine and vaginal bleeding, unspecified: Secondary | ICD-10-CM

## 2012-07-27 DIAGNOSIS — J449 Chronic obstructive pulmonary disease, unspecified: Secondary | ICD-10-CM | POA: Insufficient documentation

## 2012-07-27 DIAGNOSIS — J4489 Other specified chronic obstructive pulmonary disease: Secondary | ICD-10-CM | POA: Insufficient documentation

## 2012-07-27 DIAGNOSIS — R110 Nausea: Secondary | ICD-10-CM | POA: Insufficient documentation

## 2012-07-27 LAB — CBC WITH DIFFERENTIAL/PLATELET
Basophils Absolute: 0 10*3/uL (ref 0.0–0.1)
Basophils Relative: 0 % (ref 0–1)
Eosinophils Relative: 3 % (ref 0–5)
HCT: 42.5 % (ref 36.0–46.0)
Hemoglobin: 15 g/dL (ref 12.0–15.0)
Lymphocytes Relative: 33 % (ref 12–46)
MCHC: 35.3 g/dL (ref 30.0–36.0)
MCV: 90.2 fL (ref 78.0–100.0)
Monocytes Absolute: 0.2 10*3/uL (ref 0.1–1.0)
Monocytes Relative: 3 % (ref 3–12)
Neutro Abs: 4.2 10*3/uL (ref 1.7–7.7)
RDW: 13.3 % (ref 11.5–15.5)

## 2012-07-27 LAB — BASIC METABOLIC PANEL
BUN: 10 mg/dL (ref 6–23)
CO2: 28 mEq/L (ref 19–32)
Calcium: 9.1 mg/dL (ref 8.4–10.5)
Chloride: 100 mEq/L (ref 96–112)
Creatinine, Ser: 0.61 mg/dL (ref 0.50–1.10)

## 2012-07-27 NOTE — ED Notes (Signed)
Pt c/o n/v, lower abd pain and vag bleeding x 2 days. N/v x 4 in past 24 hrs.having large clots. Pt states always has irregular periods. Has been having vag bleeding x 4 days. Mm wet. nad at this time.

## 2012-07-27 NOTE — ED Notes (Signed)
Patient refused cath and pelvic exam.  Nurse and Doctor notified.

## 2012-07-27 NOTE — ED Notes (Signed)
Pt refused pelvic cart and in and out cath. Pt came out of room agitated and swearing at staff. EDP aware.

## 2012-08-05 ENCOUNTER — Telehealth: Payer: Self-pay | Admitting: Family Medicine

## 2012-08-05 MED ORDER — ALBUTEROL SULFATE HFA 108 (90 BASE) MCG/ACT IN AERS: 2.0000 | INHALATION_SPRAY | RESPIRATORY_TRACT | Status: DC | PRN

## 2012-08-05 MED ORDER — CITALOPRAM HYDROBROMIDE 20 MG PO TABS: 20.0000 mg | ORAL_TABLET | Freq: Every day | ORAL | Status: DC

## 2012-08-05 NOTE — Telephone Encounter (Signed)
Ok to refill xanax 

## 2012-08-05 NOTE — Telephone Encounter (Signed)
Albuterol and celexa filled pt needs to come in for appt for xanax. Pt is aware needs appt

## 2012-08-05 NOTE — Telephone Encounter (Signed)
Pt must come in for appt before Xanax medication can be refilled She did not show up 2 weeks ago

## 2012-08-07 ENCOUNTER — Telehealth: Payer: Self-pay | Admitting: Family Medicine

## 2012-08-07 NOTE — Telephone Encounter (Signed)
Ok to refill 

## 2012-08-07 NOTE — Telephone Encounter (Signed)
Pt is aware from last message, tried to call pt again to remind her [facility]

## 2012-08-07 NOTE — Telephone Encounter (Signed)
No, this is the second time I have responded to this message, she has to come in for appointment first She missed her f/u appt

## 2012-08-07 NOTE — Telephone Encounter (Signed)
Refill denied until pt makes appt

## 2012-08-11 ENCOUNTER — Ambulatory Visit (INDEPENDENT_AMBULATORY_CARE_PROVIDER_SITE_OTHER): Payer: Medicaid Other | Admitting: Family Medicine

## 2012-08-11 ENCOUNTER — Encounter: Payer: Self-pay | Admitting: Family Medicine

## 2012-08-11 ENCOUNTER — Other Ambulatory Visit: Payer: Self-pay | Admitting: Family Medicine

## 2012-08-11 VITALS — BP 110/84 | HR 78 | Temp 99.3°F | Resp 18 | Ht 68.0 in | Wt 213.0 lb

## 2012-08-11 DIAGNOSIS — F319 Bipolar disorder, unspecified: Secondary | ICD-10-CM

## 2012-08-11 DIAGNOSIS — F419 Anxiety disorder, unspecified: Secondary | ICD-10-CM

## 2012-08-11 DIAGNOSIS — F411 Generalized anxiety disorder: Secondary | ICD-10-CM

## 2012-08-11 DIAGNOSIS — J449 Chronic obstructive pulmonary disease, unspecified: Secondary | ICD-10-CM

## 2012-08-11 MED ORDER — ALBUTEROL SULFATE HFA 108 (90 BASE) MCG/ACT IN AERS: 2.0000 | INHALATION_SPRAY | RESPIRATORY_TRACT | Status: DC | PRN

## 2012-08-11 MED ORDER — CITALOPRAM HYDROBROMIDE 20 MG PO TABS: 20.0000 mg | ORAL_TABLET | Freq: Every day | ORAL | Status: DC

## 2012-08-11 MED ORDER — OMEPRAZOLE 20 MG PO CPDR: 20.0000 mg | DELAYED_RELEASE_CAPSULE | Freq: Every day | ORAL | Status: DC

## 2012-08-11 MED ORDER — ALPRAZOLAM 0.5 MG PO TABS: ORAL_TABLET | ORAL | Status: DC

## 2012-08-11 NOTE — Telephone Encounter (Signed)
Pt must come in for appt, has one scheduled this afternoon

## 2012-08-11 NOTE — Patient Instructions (Addendum)
Go back to mental heath at [facility] Continue current medications F/U 3 months

## 2012-08-11 NOTE — Telephone Encounter (Signed)
?   OK to Refill  

## 2012-08-11 NOTE — Telephone Encounter (Addendum)
Pt has appt today  Refill per Dr Jeanice Lim during office visit

## 2012-08-12 NOTE — Assessment & Plan Note (Signed)
Per above, continue benzo and SNRI
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
## 2012-08-12 NOTE — Assessment & Plan Note (Signed)
She has not been using advair the past 6 months or so, uses albuterol rarely, difficulty getting medications Difficut to gauge her breathing due to her anxiety per above

## 2012-10-06 ENCOUNTER — Telehealth: Payer: Self-pay | Admitting: Family Medicine

## 2012-10-06 ENCOUNTER — Other Ambulatory Visit: Payer: Self-pay | Admitting: Family Medicine

## 2012-10-06 NOTE — Telephone Encounter (Signed)
?   Ok to refill, last refill 08/11/12

## 2012-10-06 NOTE — Telephone Encounter (Signed)
Okay to refill? 

## 2012-10-06 NOTE — Telephone Encounter (Signed)
Xanax 0.5 mg tab 1 TID prn anxiety/sleep #90 last rf 09/08/12

## 2012-10-07 NOTE — Telephone Encounter (Signed)
Okay to refill? 

## 2012-10-07 NOTE — Telephone Encounter (Signed)
Med phoned in °

## 2012-10-07 NOTE — Telephone Encounter (Signed)
Ok to refill 

## 2012-10-12 ENCOUNTER — Encounter (HOSPITAL_COMMUNITY): Payer: Self-pay | Admitting: Emergency Medicine

## 2012-10-12 ENCOUNTER — Emergency Department (HOSPITAL_COMMUNITY)
Admission: EM | Admit: 2012-10-12 | Discharge: 2012-10-12 | Disposition: A | Discharge status: Home / Self Care | Payer: Medicaid Other | Attending: Emergency Medicine | Admitting: Emergency Medicine

## 2012-10-12 DIAGNOSIS — Z3202 Encounter for pregnancy test, result negative: Secondary | ICD-10-CM | POA: Insufficient documentation

## 2012-10-12 DIAGNOSIS — B9689 Other specified bacterial agents as the cause of diseases classified elsewhere: Secondary | ICD-10-CM

## 2012-10-12 DIAGNOSIS — Z880 Allergy status to penicillin: Secondary | ICD-10-CM | POA: Insufficient documentation

## 2012-10-12 DIAGNOSIS — R6883 Chills (without fever): Secondary | ICD-10-CM | POA: Insufficient documentation

## 2012-10-12 DIAGNOSIS — G8929 Other chronic pain: Secondary | ICD-10-CM | POA: Insufficient documentation

## 2012-10-12 DIAGNOSIS — N898 Other specified noninflammatory disorders of vagina: Secondary | ICD-10-CM | POA: Insufficient documentation

## 2012-10-12 DIAGNOSIS — R109 Unspecified abdominal pain: Secondary | ICD-10-CM | POA: Insufficient documentation

## 2012-10-12 DIAGNOSIS — R450 Nervousness: Secondary | ICD-10-CM | POA: Insufficient documentation

## 2012-10-12 DIAGNOSIS — K219 Gastro-esophageal reflux disease without esophagitis: Secondary | ICD-10-CM | POA: Insufficient documentation

## 2012-10-12 DIAGNOSIS — F17200 Nicotine dependence, unspecified, uncomplicated: Secondary | ICD-10-CM | POA: Insufficient documentation

## 2012-10-12 DIAGNOSIS — F411 Generalized anxiety disorder: Secondary | ICD-10-CM | POA: Insufficient documentation

## 2012-10-12 DIAGNOSIS — F319 Bipolar disorder, unspecified: Secondary | ICD-10-CM | POA: Insufficient documentation

## 2012-10-12 DIAGNOSIS — M129 Arthropathy, unspecified: Secondary | ICD-10-CM | POA: Insufficient documentation

## 2012-10-12 DIAGNOSIS — Z9851 Tubal ligation status: Secondary | ICD-10-CM | POA: Insufficient documentation

## 2012-10-12 DIAGNOSIS — J4489 Other specified chronic obstructive pulmonary disease: Secondary | ICD-10-CM | POA: Insufficient documentation

## 2012-10-12 DIAGNOSIS — N760 Acute vaginitis: Secondary | ICD-10-CM | POA: Insufficient documentation

## 2012-10-12 DIAGNOSIS — R110 Nausea: Secondary | ICD-10-CM | POA: Insufficient documentation

## 2012-10-12 DIAGNOSIS — Z79899 Other long term (current) drug therapy: Secondary | ICD-10-CM | POA: Insufficient documentation

## 2012-10-12 DIAGNOSIS — N939 Abnormal uterine and vaginal bleeding, unspecified: Secondary | ICD-10-CM

## 2012-10-12 DIAGNOSIS — N949 Unspecified condition associated with female genital organs and menstrual cycle: Secondary | ICD-10-CM | POA: Insufficient documentation

## 2012-10-12 DIAGNOSIS — J449 Chronic obstructive pulmonary disease, unspecified: Secondary | ICD-10-CM | POA: Insufficient documentation

## 2012-10-12 DIAGNOSIS — M549 Dorsalgia, unspecified: Secondary | ICD-10-CM | POA: Insufficient documentation

## 2012-10-12 LAB — WET PREP, GENITAL

## 2012-10-12 LAB — URINALYSIS, ROUTINE W REFLEX MICROSCOPIC
Bilirubin Urine: NEGATIVE
Glucose, UA: NEGATIVE mg/dL
Hgb urine dipstick: NEGATIVE
Ketones, ur: NEGATIVE mg/dL
Leukocytes, UA: NEGATIVE
Protein, ur: NEGATIVE mg/dL
pH: 6.5 (ref 5.0–8.0)

## 2012-10-12 LAB — CBC WITH DIFFERENTIAL/PLATELET
Basophils Relative: 0 % (ref 0–1)
Eosinophils Absolute: 0.2 10*3/uL (ref 0.0–0.7)
Eosinophils Relative: 3 % (ref 0–5)
Hemoglobin: 14.5 g/dL (ref 12.0–15.0)
Lymphs Abs: 3.1 10*3/uL (ref 0.7–4.0)
MCH: 32.9 pg (ref 26.0–34.0)
MCHC: 35.5 g/dL (ref 30.0–36.0)
MCV: 92.7 fL (ref 78.0–100.0)
Monocytes Absolute: 0.4 10*3/uL (ref 0.1–1.0)
Monocytes Relative: 5 % (ref 3–12)
Neutrophils Relative %: 52 % (ref 43–77)
RBC: 4.41 MIL/uL (ref 3.87–5.11)

## 2012-10-12 MED ORDER — MELOXICAM 7.5 MG PO TABS: 7.5000 mg | ORAL_TABLET | Freq: Every day | ORAL | Status: DC

## 2012-10-12 MED ORDER — METRONIDAZOLE 500 MG PO TABS: 500.0000 mg | ORAL_TABLET | Freq: Two times a day (BID) | ORAL | Status: DC

## 2012-10-12 MED ORDER — HYDROCODONE-ACETAMINOPHEN 5-325 MG PO TABS
1.0000 | ORAL_TABLET | Freq: Once | ORAL | Status: AC
  Administered 2012-10-12: 1 via ORAL
  Filled 2012-10-12: qty 1

## 2012-10-12 NOTE — ED Notes (Signed)
Pt returns to ED with c/o heavy vaginal bleeding and strong vaginal odor with discharge. Pt was seen here a month ago for same c/o , denies follow-up with GYN. Pt reports heavy bleeding requiring" several pads a day".  Pt also c/o lower abdominal pain during sex and after. Again denies seeking appointment with GYN. NAD noted

## 2012-10-12 NOTE — ED Notes (Addendum)
Patient c/o pelvic pain. Patient reports having a "dropped cervix." Patient states "after having all my children they diagnosed me with something I'm not sure what it's called but they told me I have a dropped cervix. Now it feels like it's dropped some more like it's about to come out of me and when I bleed it looks like tissue." Patient reports starting menstruation on Monday. Patient reports nausea and diarrhea. Denies any vomiting. Patient reports taking multiple Goody powders with no relief.

## 2012-10-12 NOTE — ED Provider Notes (Signed)
CSN: [id number]     Arrival date & time 10/12/12  1457 History   First MD Initiated Contact with Patient 10/12/12 1649     Chief Complaint  Patient presents with  . Pelvic Pain  . Vaginal Bleeding   (Consider location/radiation/quality/duration/timing/severity/associated sxs/prior Treatment) Patient is a 37 y.o. female presenting with pelvic pain. The history is provided by the patient.  Pelvic Pain This is a new problem. The current episode started in the past 7 days. The problem occurs constantly. The problem has been gradually worsening. Associated symptoms include abdominal pain, chills and nausea. Pertinent negatives include no anorexia, fever, headaches, rash, sore throat, urinary symptoms, vomiting or weakness. The symptoms are aggravated by intercourse. She has tried acetaminophen and NSAIDs for the symptoms. The treatment provided mild relief.   EMMOGENE SIMSON is a 37 y.o. female who presents to the ED with pelvic pain and vaginal bleed. She started her period 6 days ago. Has had heavy bleeding. Going through 10 pads per day x 5 days. Periods are always heavy. Has been on Megace in the past for heavy bleeding. She complains of a bad vaginal odor.  Also been told that her cervix was dropping and that makes her feel like her insides are falling out. She has had Cryo therapy by Dr. Emelda Fear and was to follow up but hasn't been able to follow up due to insurance. But last week she got her Medicaid back and plan to go back. Pain is worse when up walking or having intercourse. Last pap smear less than 2 years ago and was abnormal.   Past Medical History  Diagnosis Date  . Arthritis   . Low back pain   . COPD (chronic obstructive pulmonary disease)   . GERD (gastroesophageal reflux disease)   . Bipolar disorder   . Anxiety   . Facet joint disease MRI 2012  . Chronic pain   . Narcotic abuse     Drug seeking behavior   Past Surgical History  Procedure Laterality Date  . Tubal ligation     . Dental surgery    . Cryoablation     Family History  Problem Relation Age of Onset  . Depression Mother   . Arthritis Mother   . Heart disease Father   . Diabetes Father   . COPD Father   . COPD Sister   . Arthritis Sister   . Alcohol abuse Sister   . Hypertension Brother   . Diabetes Brother    History  Substance Use Topics  . Smoking status: Current Every Day Smoker -- 1.00 packs/day for 21 years    Types: Cigarettes  . Smokeless tobacco: Never Used  . Alcohol Use: No   OB History   Grav Para Term Preterm Abortions TAB SAB Ect Mult Living   2 2 2       2      Review of Systems  Constitutional: Positive for chills. Negative for fever.  HENT: Negative for ear pain and sore throat.   Eyes: Negative for visual disturbance.  Gastrointestinal: Positive for nausea and abdominal pain. Negative for vomiting and anorexia.  Genitourinary: Positive for vaginal bleeding and pelvic pain. Negative for dysuria and frequency.  Musculoskeletal: Positive for back pain.  Skin: Negative for rash.  Neurological: Negative for syncope, weakness and headaches.  Psychiatric/Behavioral: The patient is nervous/anxious (on medication).     Allergies  Penicillins; Bee venom; Cephalexin; Lansoprazole; and Tramadol  Home Medications   Current Outpatient Rx  Name  Route  Sig  Dispense  Refill  . Acetaminophen-Caff-Pyrilamine (MIDOL COMPLETE) 500-60-15 MG TABS   Oral   Take 1-2 tablets by mouth daily as needed (for pain).         Marland Kitchen ALPRAZolam (XANAX) 0.5 MG tablet   Oral   Take 0.5 mg by mouth 3 (three) times daily as needed for sleep or anxiety.         . Aspirin-Salicylamide-Caffeine (BC HEADACHE) 325-95-16 MG TABS   Oral   Take 1 packet by mouth as needed (for pain).         . citalopram (CELEXA) 20 MG tablet   Oral   Take 1 tablet (20 mg total) by mouth daily.   30 tablet   6   . omeprazole (PRILOSEC) 20 MG capsule   Oral   Take 1 capsule (20 mg total) by mouth  daily.   30 capsule   3   . albuterol (VENTOLIN HFA) 108 (90 BASE) MCG/ACT inhaler   Inhalation   Inhale 2 puffs into the lungs every 4 (four) hours as needed for wheezing.   1 Inhaler   3    BP 109/72  Pulse 88  Temp(Src) 98.2 F (36.8 C) (Oral)  Resp 20  Ht 5\' 8"  (1.727 m)  Wt 211 lb (95.709 kg)  BMI 32.09 kg/m2  SpO2 100%  LMP 10/12/2012 Physical Exam  Nursing note and vitals reviewed. Constitutional: She is oriented to person, place, and time. She appears well-developed and well-nourished. No distress.  HENT:  Head: Normocephalic and atraumatic.  Eyes: Conjunctivae and EOM are normal.  Neck: Neck supple.  Cardiovascular: Normal rate, regular rhythm and normal heart sounds.   Pulmonary/Chest: Effort normal.  Abdominal: Soft. There is no tenderness.  Genitourinary:  External genitalia without lesions, scant dark blood vaginal vault. Positive CMT, mild bilateral adnexal tenderness. Uterus upper limits normal.   Musculoskeletal: Normal range of motion.  Neurological: She is alert and oriented to person, place, and time. No cranial nerve deficit.  Skin: Skin is warm and dry.  Psychiatric: She has a normal mood and affect. Her behavior is normal.   Results for orders placed during the hospital encounter of 10/12/12 (from the past 24 hour(s))  CBC WITH DIFFERENTIAL     Status: None   Collection Time    10/12/12  5:07 PM      Result Value Range   WBC 7.9  4.0 - 10.5 K/uL   RBC 4.41  3.87 - 5.11 MIL/uL   Hemoglobin 14.5  12.0 - 15.0 g/dL   HCT 19.1  47.8 - 29.5 %   MCV 92.7  78.0 - 100.0 fL   MCH 32.9  26.0 - 34.0 pg   MCHC 35.5  30.0 - 36.0 g/dL   RDW 62.1  30.8 - 65.7 %   Platelets 214  150 - 400 K/uL   Neutrophils Relative % 52  43 - 77 %   Neutro Abs 4.1  1.7 - 7.7 K/uL   Lymphocytes Relative 39  12 - 46 %   Lymphs Abs 3.1  0.7 - 4.0 K/uL   Monocytes Relative 5  3 - 12 %   Monocytes Absolute 0.4  0.1 - 1.0 K/uL   Eosinophils Relative 3  0 - 5 %   Eosinophils  Absolute 0.2  0.0 - 0.7 K/uL   Basophils Relative 0  0 - 1 %   Basophils Absolute 0.0  0.0 - 0.1 K/uL  WET PREP, GENITAL  Status: Abnormal   Collection Time    10/12/12  5:40 PM      Result Value Range   Yeast Wet Prep HPF POC NONE SEEN  NONE SEEN   Trich, Wet Prep NONE SEEN  NONE SEEN   Clue Cells Wet Prep HPF POC FEW (*) NONE SEEN   WBC, Wet Prep HPF POC FEW (*) NONE SEEN  PREGNANCY, URINE     Status: None   Collection Time    10/12/12  5:41 PM      Result Value Range   Preg Test, Ur NEGATIVE  NEGATIVE  URINALYSIS, ROUTINE W REFLEX MICROSCOPIC     Status: None   Collection Time    10/12/12  5:41 PM      Result Value Range   Color, Urine YELLOW  YELLOW   APPearance CLEAR  CLEAR   Specific Gravity, Urine 1.025  1.005 - 1.030   pH 6.5  5.0 - 8.0   Glucose, UA NEGATIVE  NEGATIVE mg/dL   Hgb urine dipstick NEGATIVE  NEGATIVE   Bilirubin Urine NEGATIVE  NEGATIVE   Ketones, ur NEGATIVE  NEGATIVE mg/dL   Protein, ur NEGATIVE  NEGATIVE mg/dL   Urobilinogen, UA 0.2  0.0 - 1.0 mg/dL   Nitrite NEGATIVE  NEGATIVE   Leukocytes, UA NEGATIVE  NEGATIVE    ED Course  Procedures   MDM  37 y.o. female with history of abnormal vaginal bleeding. Scant dark blood on pelvic exam today. HGB 14.5 and vital signs normal. Patient stable for discharge home without any immediate complications. She is to call Dr. Rayna Sexton office in the morning for follow up.  I have reviewed this patient's vital signs, nurses notes, appropriate labs and discussed findings with the patient. She voices understanding.    Medication List    TAKE these medications       meloxicam 7.5 MG tablet  Commonly known as:  MOBIC  Take 1 tablet (7.5 mg total) by mouth daily.     metroNIDAZOLE 500 MG tablet  Commonly known as:  FLAGYL  Take 1 tablet (500 mg total) by mouth 2 (two) times daily.      ASK your doctor about these medications       albuterol 108 (90 BASE) MCG/ACT inhaler  Commonly known as:  VENTOLIN HFA   Inhale 2 puffs into the lungs every 4 (four) hours as needed for wheezing.     ALPRAZolam 0.5 MG tablet  Commonly known as:  XANAX  Take 0.5 mg by mouth 3 (three) times daily as needed for sleep or anxiety.     BC HEADACHE 325-95-16 MG Tabs  Generic drug:  Aspirin-Salicylamide-Caffeine  Take 1 packet by mouth as needed (for pain).     citalopram 20 MG tablet  Commonly known as:  CELEXA  Take 1 tablet (20 mg total) by mouth daily.     MIDOL COMPLETE 500-60-15 MG Tabs  Generic drug:  Acetaminophen-Caff-Pyrilamine  Take 1-2 tablets by mouth daily as needed (for pain).     omeprazole 20 MG capsule  Commonly known as:  PRILOSEC  Take 1 capsule (20 mg total) by mouth daily.         [facility] Orlene Och, [place] 10/12/12 [phone]

## 2012-10-14 LAB — GC/CHLAMYDIA PROBE AMP
CT Probe RNA: NEGATIVE
GC Probe RNA: NEGATIVE

## 2012-10-17 NOTE — ED Provider Notes (Signed)
Medical screening examination/treatment/procedure(s) were performed by non-physician practitioner and as supervising physician I was immediately available for consultation/collaboration.  Debraann Livingstone, MD 10/17/12 2221 

## 2012-11-24 ENCOUNTER — Ambulatory Visit (INDEPENDENT_AMBULATORY_CARE_PROVIDER_SITE_OTHER): Payer: Medicaid Other | Admitting: Family Medicine

## 2012-11-24 ENCOUNTER — Encounter: Payer: Self-pay | Admitting: Family Medicine

## 2012-11-24 VITALS — BP 110/70 | HR 78 | Temp 98.2°F | Resp 18 | Ht 66.5 in | Wt 210.0 lb

## 2012-11-24 DIAGNOSIS — R1310 Dysphagia, unspecified: Secondary | ICD-10-CM | POA: Insufficient documentation

## 2012-11-24 DIAGNOSIS — M545 Low back pain, unspecified: Secondary | ICD-10-CM

## 2012-11-24 DIAGNOSIS — K219 Gastro-esophageal reflux disease without esophagitis: Secondary | ICD-10-CM | POA: Insufficient documentation

## 2012-11-24 DIAGNOSIS — N938 Other specified abnormal uterine and vaginal bleeding: Secondary | ICD-10-CM

## 2012-11-24 DIAGNOSIS — F419 Anxiety disorder, unspecified: Secondary | ICD-10-CM

## 2012-11-24 DIAGNOSIS — F411 Generalized anxiety disorder: Secondary | ICD-10-CM

## 2012-11-24 DIAGNOSIS — F319 Bipolar disorder, unspecified: Secondary | ICD-10-CM

## 2012-11-24 DIAGNOSIS — N925 Other specified irregular menstruation: Secondary | ICD-10-CM

## 2012-11-24 DIAGNOSIS — N949 Unspecified condition associated with female genital organs and menstrual cycle: Secondary | ICD-10-CM

## 2012-11-24 MED ORDER — OMEPRAZOLE 40 MG PO CPDR: 40.0000 mg | DELAYED_RELEASE_CAPSULE | Freq: Every day | ORAL | Status: DC

## 2012-11-24 MED ORDER — TRAMADOL HCL 50 MG PO TABS
50.0000 mg | ORAL_TABLET | Freq: Three times a day (TID) | ORAL | Status: DC | PRN
Start: 2012-11-24 — End: 2012-12-19

## 2012-11-24 MED ORDER — ALPRAZOLAM 0.5 MG PO TABS: 0.5000 mg | ORAL_TABLET | Freq: Three times a day (TID) | ORAL | Status: DC | PRN

## 2012-11-24 NOTE — Assessment & Plan Note (Addendum)
Increase PPI to 40mg  daily  Referral to GI She needs to stop the anti-inflammatories

## 2012-11-24 NOTE — Progress Notes (Signed)
Subjective:    Patient ID: Shannon Reese, female    DOB: May 24, 1975, 37 y.o.   MRN: [id number]  HPI  Patient is here to followup hospital visit to the ER. She was seen secondary to nausea and vomiting associated with lower, pain and pelvic pain. She continues to have difficulty with heavy bleeding and prolonged bleeding. She's not seen GYN for her dysfunction uterine bleeding. She was also treated for bacterial vaginosis. Diarrhea and Chlamydia were negative. States typically her symptoms with the vomiting upset stomach occur during her period however she has had worsening episodes of vomiting not associated with the lower pelvic pain. She often gets choked on her regular foods including meat and water. She was told in the pass she needed EGD however was afraid to follow through with this. She is to stop her Prilosec a few months ago as well and her acid reflux is worsened. She's been taking a significant amount of BC powder secondary to low back pain and arthritis pain. She was given meloxicam last month however did not fill this helps there for return to the [facility] powder. She is asking for something for her arthritis and back pain.  She has an appointment to see a Dr. Harrold Donath in [place] for her bipolar and anxiety on Wednesday. She stopped taking her Celexa couple months ago stated it made her feel bad. It is unclear if this is a psychologist or psychiatrist. She still taking her Xanax typically to help her sleep. She denies any illicit drug use recently.   Review of Systems   GEN- denies fatigue, fever, weight loss,weakness, recent illness HEENT- denies eye drainage, change in vision, nasal discharge, CVS- denies chest pain, palpitations RESP- denies SOB, cough, wheeze ABD- denies N/V, change in stools, +abd pain GU- denies dysuria, hematuria, dribbling, incontinence MSK- + joint pain, muscle aches, injury Neuro-+ headache, dizziness, syncope, seizure activity      Objective:   Physical  Exam GEN- NAD, alert and oriented x3 HEENT- PERRL, EOMI, non injected sclera, pink conjunctiva, MMM, oropharynx clear Neck- Supple,  CVS- RRR, no murmur RESP-CTAB ABD-NABS,soft, mild TTP to epigastric region and lower pelvic region, ND EXT- No edema Pulses- Radial 2+ PSYCH- normal affect and mood        Assessment & Plan:

## 2012-11-24 NOTE — Assessment & Plan Note (Signed)
Referral to GI  

## 2012-11-24 NOTE — Assessment & Plan Note (Signed)
She will return to GYN for further evaluation

## 2012-11-24 NOTE — Patient Instructions (Addendum)
REferral  To Dr. Karilyn Cota   Prilosec increase to 40mg  - take 2 tablets  New prescription  Ultram for pain FLU SHOT GIVEN Schedule a follow-up with GYN for your bleeding Call if new psychiatrist is needed to fill  Medications F/U 4 months

## 2012-11-24 NOTE — Assessment & Plan Note (Signed)
Continue current xanax

## 2012-11-24 NOTE — Assessment & Plan Note (Signed)
Unfortunately she has had significant history with substance abuse She needs to stop the NSAIDS as she is overdoing it and now with a lot of GI upset I have given her 30 tablets of Ultram, nothing stronger will be prescribed and pt aware of this She can also take Tyelnol

## 2012-11-24 NOTE — Assessment & Plan Note (Signed)
Advised that she needs psychiatry and they will have to prescribe her medications including Xanax

## 2012-12-03 ENCOUNTER — Telehealth: Payer: Self-pay | Admitting: Family Medicine

## 2012-12-03 NOTE — Telephone Encounter (Signed)
Pt is calling to see why she cant get her Xanax until the 28th because she was under the impression she could get that refilled today Pharm[facility] Call back number [phone]

## 2012-12-10 ENCOUNTER — Encounter (INDEPENDENT_AMBULATORY_CARE_PROVIDER_SITE_OTHER): Payer: Self-pay | Admitting: *Deleted

## 2012-12-15 ENCOUNTER — Ambulatory Visit: Payer: Medicaid Other | Admitting: Obstetrics & Gynecology

## 2012-12-19 ENCOUNTER — Ambulatory Visit (INDEPENDENT_AMBULATORY_CARE_PROVIDER_SITE_OTHER): Payer: Medicaid Other | Admitting: Internal Medicine

## 2012-12-19 ENCOUNTER — Encounter (INDEPENDENT_AMBULATORY_CARE_PROVIDER_SITE_OTHER): Payer: Self-pay | Admitting: Internal Medicine

## 2012-12-19 VITALS — BP 94/66 | HR 72 | Temp 98.9°F | Ht 69.0 in | Wt 208.5 lb

## 2012-12-19 DIAGNOSIS — R1310 Dysphagia, unspecified: Secondary | ICD-10-CM

## 2012-12-19 DIAGNOSIS — K219 Gastro-esophageal reflux disease without esophagitis: Secondary | ICD-10-CM

## 2012-12-19 NOTE — Progress Notes (Signed)
Subjective:     Patient ID: Shannon Reese, female   DOB: 1975/03/03, 37 y.o.   MRN: [id number]  HPI Referred to our office for epigastric pain. She tells me she "eats" BC's like crazy. She actually stopped the BC's 3 weeks ago. She stopped the Meloxicam about 3 weeks ago.  She was taking 3 a day for her chronic pain. She has had epigastric pain for over a year.  She avoid meats because they will lodge. Pills will lodge also. Occurs at least once a month. She says eating worse since she has dentures.  Appetite is not good. She has lost 40 pounds unintentionally. In December of 2013 she weighed 214. Today her weight is 208.5lb. She usually has a BM 1-2 a month. Stools are dark brown in color. She has not seen any bright red rectal bleeding or melena. She takes Miralax on a prn basis.     Review of Systems see hpi Current Outpatient Prescriptions  Medication Sig Dispense Refill  . Acetaminophen-Caff-Pyrilamine (MIDOL COMPLETE) 500-60-15 MG TABS Take 1-2 tablets by mouth daily as needed (for pain).      Marland Kitchen albuterol (VENTOLIN HFA) 108 (90 BASE) MCG/ACT inhaler Inhale 2 puffs into the lungs every 4 (four) hours as needed for wheezing.  1 Inhaler  3  . ALPRAZolam (XANAX) 0.5 MG tablet Take 1 tablet (0.5 mg total) by mouth 3 (three) times daily as needed for sleep or anxiety.  90 tablet  1  . omeprazole (PRILOSEC) 40 MG capsule Take 1 capsule (40 mg total) by mouth daily.  30 capsule  3  . [DISCONTINUED] clonazePAM (KLONOPIN) 1 MG tablet Take 1 tablet (1 mg total) by mouth 2 (two) times daily as needed for anxiety.  60 tablet  1   No current facility-administered medications for this visit.   Past Medical History  Diagnosis Date  . Arthritis   . Low back pain   . COPD (chronic obstructive pulmonary disease)   . GERD (gastroesophageal reflux disease)   . Bipolar disorder   . Anxiety   . Facet joint disease MRI 2012  . Chronic pain   . Narcotic abuse     Drug seeking behavior   Past  Surgical History  Procedure Laterality Date  . Tubal ligation    . Dental surgery    . Cryoablation     Allergies  Allergen Reactions  . Penicillins Anaphylaxis  . Bee Venom Swelling    Entire body swells  . Cephalexin Hives and Swelling  . Lansoprazole Hives and Swelling  . Tramadol Palpitations        Objective:   Physical Exam  Filed Vitals:   12/19/12 0906  BP: 94/66  Pulse: 72  Temp: 98.9 F (37.2 C)  Height: 5\' 9"  (1.753 m)  Weight: 208 lb 8 oz (94.575 kg)  Alert and oriented. Skin warm and dry. Oral mucosa is moist.   . Sclera anicteric, conjunctivae is pink. Thyroid not enlarged. No cervical lymphadenopathy. Lungs clear. Heart regular rate and rhythm.  Abdomen is soft. Bowel sounds are positive. No hepatomegaly. No abdominal masses felt. No tenderness.  No edema to lower extremities.        Assessment:     GERD. Dysphagia. GERD possible NSAID induced. Chronic constipation. Takes Miralax on a prn basis.    Plan:    EGD/ED with Dr Karilyn Cota. The risks and benefits such as perforation, bleeding, and infection were reviewed with the patient and is agreeable.   Take Miralax  1 scoop daily. Increase fiber in diet. No more NSAIDs.

## 2012-12-19 NOTE — Patient Instructions (Signed)
EGD/ED. The risks and benefits such as perforation, bleeding, and infection were reviewed with the patient and is agreeable. 

## 2012-12-22 ENCOUNTER — Other Ambulatory Visit (INDEPENDENT_AMBULATORY_CARE_PROVIDER_SITE_OTHER): Payer: Self-pay | Admitting: *Deleted

## 2012-12-22 ENCOUNTER — Encounter (INDEPENDENT_AMBULATORY_CARE_PROVIDER_SITE_OTHER): Payer: Self-pay | Admitting: *Deleted

## 2012-12-22 DIAGNOSIS — K219 Gastro-esophageal reflux disease without esophagitis: Secondary | ICD-10-CM

## 2012-12-22 DIAGNOSIS — R1310 Dysphagia, unspecified: Secondary | ICD-10-CM

## 2012-12-25 ENCOUNTER — Encounter: Payer: Self-pay | Admitting: Obstetrics & Gynecology

## 2012-12-25 ENCOUNTER — Ambulatory Visit (INDEPENDENT_AMBULATORY_CARE_PROVIDER_SITE_OTHER): Payer: Medicaid Other | Admitting: Obstetrics & Gynecology

## 2012-12-25 VITALS — BP 120/80 | Ht 68.0 in | Wt 208.0 lb

## 2012-12-25 DIAGNOSIS — N926 Irregular menstruation, unspecified: Secondary | ICD-10-CM

## 2012-12-25 DIAGNOSIS — IMO0002 Reserved for concepts with insufficient information to code with codable children: Secondary | ICD-10-CM

## 2012-12-25 DIAGNOSIS — N920 Excessive and frequent menstruation with regular cycle: Secondary | ICD-10-CM

## 2012-12-25 MED ORDER — MEGESTROL ACETATE 40 MG PO TABS: 40.0000 mg | ORAL_TABLET | Freq: Every day | ORAL | Status: DC
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
## 2012-12-30 ENCOUNTER — Encounter (HOSPITAL_COMMUNITY): Payer: Self-pay | Admitting: Pharmacy Technician

## 2013-01-13 ENCOUNTER — Encounter (HOSPITAL_COMMUNITY): Admission: RE | Payer: Self-pay | Source: Ambulatory Visit

## 2013-01-13 ENCOUNTER — Ambulatory Visit (HOSPITAL_COMMUNITY): Admission: RE | Admit: 2013-01-13 | Payer: Medicaid Other | Source: Ambulatory Visit | Admitting: Internal Medicine

## 2013-01-13 SURGERY — ESOPHAGOGASTRODUODENOSCOPY (EGD) WITH ESOPHAGEAL DILATION
Anesthesia: Moderate Sedation

## 2013-01-14 ENCOUNTER — Other Ambulatory Visit (INDEPENDENT_AMBULATORY_CARE_PROVIDER_SITE_OTHER): Payer: Self-pay | Admitting: *Deleted

## 2013-01-14 ENCOUNTER — Encounter (INDEPENDENT_AMBULATORY_CARE_PROVIDER_SITE_OTHER): Payer: Self-pay | Admitting: *Deleted

## 2013-01-14 DIAGNOSIS — R1013 Epigastric pain: Secondary | ICD-10-CM

## 2013-01-14 DIAGNOSIS — R1310 Dysphagia, unspecified: Secondary | ICD-10-CM

## 2013-01-14 DIAGNOSIS — G8929 Other chronic pain: Secondary | ICD-10-CM

## 2013-01-14 DIAGNOSIS — K219 Gastro-esophageal reflux disease without esophagitis: Secondary | ICD-10-CM

## 2013-01-15 ENCOUNTER — Ambulatory Visit: Payer: Medicaid Other | Admitting: Obstetrics & Gynecology

## 2013-01-15 ENCOUNTER — Encounter (HOSPITAL_COMMUNITY): Payer: Self-pay | Admitting: Pharmacy Technician

## 2013-01-15 ENCOUNTER — Other Ambulatory Visit: Payer: Medicaid Other

## 2013-01-23 ENCOUNTER — Other Ambulatory Visit: Payer: Self-pay | Admitting: Obstetrics & Gynecology

## 2013-01-23 ENCOUNTER — Encounter (INDEPENDENT_AMBULATORY_CARE_PROVIDER_SITE_OTHER): Payer: Self-pay

## 2013-01-23 ENCOUNTER — Ambulatory Visit (INDEPENDENT_AMBULATORY_CARE_PROVIDER_SITE_OTHER): Payer: Medicaid Other

## 2013-01-23 DIAGNOSIS — IMO0002 Reserved for concepts with insufficient information to code with codable children: Secondary | ICD-10-CM

## 2013-01-23 DIAGNOSIS — N920 Excessive and frequent menstruation with regular cycle: Secondary | ICD-10-CM

## 2013-01-23 DIAGNOSIS — N946 Dysmenorrhea, unspecified: Secondary | ICD-10-CM

## 2013-01-28 ENCOUNTER — Ambulatory Visit (HOSPITAL_COMMUNITY): Admission: RE | Admit: 2013-01-28 | Payer: Medicaid Other | Source: Ambulatory Visit | Admitting: Internal Medicine

## 2013-01-28 ENCOUNTER — Telehealth: Payer: Self-pay | Admitting: Family Medicine

## 2013-01-28 SURGERY — ESOPHAGOGASTRODUODENOSCOPY (EGD) WITH ESOPHAGEAL DILATION
Anesthesia: Moderate Sedation

## 2013-01-28 NOTE — Progress Notes (Signed)
Pt. Called to check to see if she was coming for her EGD with Dr. Karilyn Cotaehman. Pt. States" she forgot and will need to reschedule"  Dr. Patty Sermonsehman's office notified.

## 2013-01-28 NOTE — Telephone Encounter (Signed)
Pt is wanting to let you know that [facility] the [facility] this apt is with the consuler she won't get to see the Dr until February  She hasn't been given any medications because the person she is seeing now does not write prescriptions she is wanting to let Dr Jeanice Limurham know this because the pharmacy is suppose to have faxed or going to fax over a prescription request Call back number is [phone]

## 2013-01-29 MED ORDER — ALPRAZOLAM 0.5 MG PO TABS: 0.5000 mg | ORAL_TABLET | Freq: Three times a day (TID) | ORAL | Status: DC | PRN

## 2013-01-29 NOTE — Telephone Encounter (Signed)
Okay to refill xanax. 

## 2013-01-29 NOTE — Telephone Encounter (Signed)
Med phoned in °

## 2013-02-02 ENCOUNTER — Encounter: Payer: Self-pay | Admitting: Obstetrics & Gynecology

## 2013-02-02 ENCOUNTER — Ambulatory Visit (INDEPENDENT_AMBULATORY_CARE_PROVIDER_SITE_OTHER): Payer: Medicaid Other | Admitting: Obstetrics & Gynecology

## 2013-02-02 VITALS — BP 110/70 | Wt 213.0 lb

## 2013-02-02 DIAGNOSIS — N946 Dysmenorrhea, unspecified: Secondary | ICD-10-CM

## 2013-02-02 DIAGNOSIS — IMO0002 Reserved for concepts with insufficient information to code with codable children: Secondary | ICD-10-CM

## 2013-02-02 NOTE — Progress Notes (Signed)
Patient ID: Shannon ReichertMelissa D Reese, female   DOB: 05-29-1975, 38 y.o.   MRN: [id number] Sonogram reviewed and essentially normal  Pt has responded to megace with bleeding and non sex pain She continues to have pain with sex every time  Proceed with TVH on 03/04/2013 Orders done  Past Medical History  Diagnosis Date  . Arthritis   . Low back pain   . COPD (chronic obstructive pulmonary disease)   . GERD (gastroesophageal reflux disease)   . Bipolar disorder   . Anxiety   . Facet joint disease MRI 2012  . Chronic pain   . Narcotic abuse     Drug seeking behavior    Past Surgical History  Procedure Laterality Date  . Tubal ligation    . Dental surgery    . Cryoablation      OB History   Grav Para Term Preterm Abortions TAB SAB Ect Mult Living   2 2 2       2       Allergies  Allergen Reactions  . Penicillins Anaphylaxis  . Bee Venom Swelling    Entire body swells  . Cephalexin Hives and Swelling  . Lansoprazole Hives and Swelling  . Tramadol Palpitations    History   Social History  . Marital Status: Single    Spouse Name: N/A    Number of Children: N/A  . Years of Education: N/A   Social History Main Topics  . Smoking status: Current Every Day Smoker -- 1.00 packs/day for 21 years    Types: Cigarettes  . Smokeless tobacco: Never Used  . Alcohol Use: No  . Drug Use: No  . Sexual Activity: Yes    Birth Control/ Protection: Surgical   Other Topics Concern  . None   Social History Narrative  . None    Family History  Problem Relation Age of Onset  . Depression Mother   . Arthritis Mother   . Heart disease Father   . Diabetes Father   . COPD Father   . COPD Sister   . Arthritis Sister   . Alcohol abuse Sister   . Hypertension Brother   . Diabetes Brother

## 2013-02-05 ENCOUNTER — Other Ambulatory Visit: Payer: Self-pay | Admitting: Family Medicine

## 2013-02-06 ENCOUNTER — Telehealth: Payer: Self-pay | Admitting: Family Medicine

## 2013-02-06 MED ORDER — ALBUTEROL SULFATE HFA 108 (90 BASE) MCG/ACT IN AERS
2.0000 | INHALATION_SPRAY | Freq: Four times a day (QID) | RESPIRATORY_TRACT | Status: DC | PRN
Start: 2013-02-06 — End: 2014-01-12

## 2013-02-06 NOTE — Telephone Encounter (Signed)
Medication refilled per protocol. 

## 2013-02-23 ENCOUNTER — Encounter (HOSPITAL_COMMUNITY): Payer: Self-pay | Admitting: Pharmacy Technician

## 2013-02-25 NOTE — Patient Instructions (Signed)
Shannon Reese  02/25/2013   Your procedure is scheduled on:   03/04/2013  Report to [facility] at  700  AM.  Call this number if you have problems the morning of surgery: [phone]   Remember:   Do not eat food or drink liquids after midnight.   Take these medicines the morning of surgery with A SIP OF WATER:  Xanax, prilosec. Take your proair before you come and bring it with you.   Do not wear jewelry, make-up or nail polish.  Do not wear lotions, powders, or perfumes.   Do not shave 48 hours prior to surgery. Men may shave face and neck.  Do not bring valuables to the hospital.  [facility] is not responsible for any belongings or valuables.               Contacts, dentures or bridgework may not be worn into surgery.  Leave suitcase in the car. After surgery it may be brought to your room.  For patients admitted to the hospital, discharge time is determined by your treatment team.               Patients discharged the day of surgery will not be allowed to drive home.  Name and phone number of your driver: family  Special Instructions: Shower using CHG 2 nights before surgery and the night before surgery.  If you shower the day of surgery use CHG.  Use special wash - you have one bottle of CHG for all showers.  You should use approximately 1/3 of the bottle for each shower.   Please read over the following fact sheets that you were given: Pain Booklet, Coughing and Deep Breathing, Surgical Site Infection Prevention, Anesthesia Post-op Instructions and Care and Recovery After Surgery Hysterectomy Information  A hysterectomy is a surgery in which your uterus is removed. This surgery may be done to treat various medical problems. After the surgery, you will no longer have menstrual periods. The surgery will also make you unable to become pregnant (sterile). The fallopian tubes and ovaries can be removed (bilateral salpingo-oophorectomy) during this surgery as well.  REASONS  FOR A HYSTERECTOMY  Persistent, abnormal bleeding.  Lasting (chronic) pelvic pain or infection.  The lining of the uterus (endometrium) starts growing outside the uterus (endometriosis).  The endometrium starts growing in the muscle of the uterus (adenomyosis).  The uterus falls down into the vagina (pelvic organ prolapse).  Noncancerous growths in the uterus (uterine fibroids) that cause symptoms.  Precancerous cells.  Cervical cancer or uterine cancer. TYPES OF HYSTERECTOMIES  Supracervical hysterectomy In this type, the top part of the uterus is removed, but not the cervix.  Total hysterectomy The uterus and cervix are removed.  Radical hysterectomy The uterus, the cervix, and the fibrous tissue that holds the uterus in place in the pelvis (parametrium) are removed. WAYS A HYSTERECTOMY CAN BE PERFORMED  Abdominal hysterectomy A large surgical cut (incision) is made in the abdomen. The uterus is removed through this incision.  Vaginal hysterectomy An incision is made in the vagina. The uterus is removed through this incision. There are no abdominal incisions.  Conventional laparoscopic hysterectomy Three or four small incisions are made in the abdomen. A thin, lighted tube with a camera (laparoscope) is inserted into one of the incisions. Other tools are put through the other incisions. The uterus is cut into small pieces. The small pieces are removed through the incisions, or they are removed  through the vagina.  Laparoscopically assisted vaginal hysterectomy (LAVH) Three or four small incisions are made in the abdomen. Part of the surgery is performed laparoscopically and part vaginally. The uterus is removed through the vagina.  Robot-assisted laparoscopic hysterectomy A laparoscope and other tools are inserted into 3 or 4 small incisions in the abdomen. A computer-controlled device is used to give the surgeon a 3D image and to help control the surgical instruments. This allows  for more precise movements of surgical instruments. The uterus is cut into small pieces and removed through the incisions or removed through the vagina. RISKS AND COMPLICATIONS  Possible complications associated with this procedure include:  Bleeding and risk of blood transfusion. Tell your health care provider if you do not want to receive any blood products.  Blood clots in the legs or lung.  Infection.  Injury to surrounding organs.  Problems or side effects related to anesthesia.  Conversion to an abdominal hysterectomy from one of the other techniques. WHAT TO EXPECT AFTER A HYSTERECTOMY  You will be given pain medicine.  You will need to have someone with you for the first 3 5 days after you go home.  You will need to follow up with your surgeon in 2 4 weeks after surgery to evaluate your progress.  You may have early menopause symptoms such as hot flashes, night sweats, and insomnia.  If you had a hysterectomy for a problem that was not cancer or not a condition that could lead to cancer, then you no longer need Pap tests. However, even if you no longer need a Pap test, a regular exam is a good idea to make sure no other problems are starting. Document Released: 06/20/2000 Document Revised: 10/15/2012 Document Reviewed: 09/01/2012 [facility] Patient Information 2014 [place]. PATIENT INSTRUCTIONS POST-ANESTHESIA  IMMEDIATELY FOLLOWING SURGERY:  Do not drive or operate machinery for the first twenty four hours after surgery.  Do not make any important decisions for twenty four hours after surgery or while taking narcotic pain medications or sedatives.  If you develop intractable nausea and vomiting or a severe headache please notify your doctor immediately.  FOLLOW-UP:  Please make an appointment with your surgeon as instructed. You do not need to follow up with anesthesia unless specifically instructed to do so.  WOUND CARE INSTRUCTIONS (if applicable):  Keep a dry  clean dressing on the anesthesia/puncture wound site if there is drainage.  Once the wound has quit draining you may leave it open to air.  Generally you should leave the bandage intact for twenty four hours unless there is drainage.  If the epidural site drains for more than 36-48 hours please call the anesthesia department.  QUESTIONS?:  Please feel free to call your physician or the hospital operator if you have any questions, and they will be happy to assist you.

## 2013-02-26 ENCOUNTER — Encounter (HOSPITAL_COMMUNITY): Payer: Self-pay

## 2013-02-26 ENCOUNTER — Encounter (HOSPITAL_COMMUNITY)
Admission: RE | Admit: 2013-02-26 | Discharge: 2013-02-26 | Disposition: A | Discharge status: Home / Self Care | Payer: Medicaid Other | Source: Ambulatory Visit | Attending: Obstetrics & Gynecology | Admitting: Obstetrics & Gynecology

## 2013-02-26 DIAGNOSIS — Z01812 Encounter for preprocedural laboratory examination: Secondary | ICD-10-CM | POA: Insufficient documentation

## 2013-02-26 LAB — CBC
HCT: 38.9 % (ref 36.0–46.0)
HEMOGLOBIN: 13.6 g/dL (ref 12.0–15.0)
MCH: 32.4 pg (ref 26.0–34.0)
MCHC: 35 g/dL (ref 30.0–36.0)
MCV: 92.6 fL (ref 78.0–100.0)
Platelets: 236 10*3/uL (ref 150–400)
RBC: 4.2 MIL/uL (ref 3.87–5.11)
RDW: 13 % (ref 11.5–15.5)
WBC: 6.2 10*3/uL (ref 4.0–10.5)

## 2013-02-26 LAB — URINALYSIS, ROUTINE W REFLEX MICROSCOPIC
BILIRUBIN URINE: NEGATIVE
GLUCOSE, UA: NEGATIVE mg/dL
Ketones, ur: NEGATIVE mg/dL
NITRITE: NEGATIVE
PH: 6 (ref 5.0–8.0)
SPECIFIC GRAVITY, URINE: 1.025 (ref 1.005–1.030)
UROBILINOGEN UA: 0.2 mg/dL (ref 0.0–1.0)

## 2013-02-26 LAB — COMPREHENSIVE METABOLIC PANEL
ALK PHOS: 73 U/L (ref 39–117)
ALT: 11 U/L (ref 0–35)
AST: 12 U/L (ref 0–37)
Albumin: 3.2 g/dL — ABNORMAL LOW (ref 3.5–5.2)
BILIRUBIN TOTAL: 0.2 mg/dL — AB (ref 0.3–1.2)
BUN: 16 mg/dL (ref 6–23)
CHLORIDE: 104 meq/L (ref 96–112)
CO2: 26 meq/L (ref 19–32)
Calcium: 8.8 mg/dL (ref 8.4–10.5)
Creatinine, Ser: 0.68 mg/dL (ref 0.50–1.10)
GLUCOSE: 118 mg/dL — AB (ref 70–99)
Potassium: 3.8 mEq/L (ref 3.7–5.3)
SODIUM: 142 meq/L (ref 137–147)
Total Protein: 6.5 g/dL (ref 6.0–8.3)

## 2013-02-26 LAB — HCG, QUANTITATIVE, PREGNANCY: hCG, Beta Chain, Quant, S: <1 m[IU]/mL (ref <5)

## 2013-02-26 LAB — URINE MICROSCOPIC-ADD ON

## 2013-02-27 ENCOUNTER — Other Ambulatory Visit: Payer: Self-pay | Admitting: Family Medicine

## 2013-02-27 NOTE — Telephone Encounter (Signed)
RX called in .

## 2013-02-27 NOTE — Telephone Encounter (Signed)
Okay to refill? 

## 2013-02-27 NOTE — Telephone Encounter (Signed)
Message copied by Samuella CotaSIMMONS, Brad Mcgaughy T on Fri Feb 27, 2013 12:43 PM ------      Message from: Harriet MassonOBERTS, CHELSEA N      Created: Thu Feb 26, 2013  2:31 PM      Regarding: About upcoming Surgery       Contact: [phone]       Pt is needing to speak to you about several things, regarding her upcoming surgery on Wednesday, and something about [facility]? ------

## 2013-02-27 NOTE — Telephone Encounter (Signed)
Pt wants to know if she can get her refill on Xanax, she is having surgery Hysterectomy on Wednesday and her surgeon suggested she take one before her surgery.OK to refill?

## 2013-02-28 LAB — TYPE AND SCREEN
ABO/RH(D): O NEG
ANTIBODY SCREEN: NEGATIVE

## 2013-03-04 ENCOUNTER — Encounter (HOSPITAL_COMMUNITY): Payer: Self-pay | Admitting: *Deleted

## 2013-03-04 ENCOUNTER — Ambulatory Visit (HOSPITAL_COMMUNITY): Payer: Medicaid Other | Admitting: Anesthesiology

## 2013-03-04 ENCOUNTER — Encounter (HOSPITAL_COMMUNITY)
Admission: RE | Disposition: A | Discharge status: Home / Self Care | Payer: Self-pay | Source: Ambulatory Visit | Attending: Obstetrics & Gynecology

## 2013-03-04 ENCOUNTER — Ambulatory Visit (HOSPITAL_COMMUNITY)
Admission: RE | Admit: 2013-03-04 | Discharge: 2013-03-04 | Disposition: A | Discharge status: Home / Self Care | Payer: Medicaid Other | Source: Ambulatory Visit | Attending: Obstetrics & Gynecology | Admitting: Obstetrics & Gynecology

## 2013-03-04 ENCOUNTER — Encounter (HOSPITAL_COMMUNITY): Payer: Medicaid Other | Admitting: Anesthesiology

## 2013-03-04 DIAGNOSIS — Z881 Allergy status to other antibiotic agents status: Secondary | ICD-10-CM | POA: Insufficient documentation

## 2013-03-04 DIAGNOSIS — F17200 Nicotine dependence, unspecified, uncomplicated: Secondary | ICD-10-CM | POA: Insufficient documentation

## 2013-03-04 DIAGNOSIS — M129 Arthropathy, unspecified: Secondary | ICD-10-CM | POA: Insufficient documentation

## 2013-03-04 DIAGNOSIS — N840 Polyp of corpus uteri: Secondary | ICD-10-CM

## 2013-03-04 DIAGNOSIS — IMO0002 Reserved for concepts with insufficient information to code with codable children: Secondary | ICD-10-CM

## 2013-03-04 DIAGNOSIS — F319 Bipolar disorder, unspecified: Secondary | ICD-10-CM | POA: Insufficient documentation

## 2013-03-04 DIAGNOSIS — K219 Gastro-esophageal reflux disease without esophagitis: Secondary | ICD-10-CM | POA: Insufficient documentation

## 2013-03-04 DIAGNOSIS — F411 Generalized anxiety disorder: Secondary | ICD-10-CM | POA: Insufficient documentation

## 2013-03-04 DIAGNOSIS — J4489 Other specified chronic obstructive pulmonary disease: Secondary | ICD-10-CM | POA: Insufficient documentation

## 2013-03-04 DIAGNOSIS — J449 Chronic obstructive pulmonary disease, unspecified: Secondary | ICD-10-CM | POA: Insufficient documentation

## 2013-03-04 DIAGNOSIS — Z880 Allergy status to penicillin: Secondary | ICD-10-CM | POA: Insufficient documentation

## 2013-03-04 DIAGNOSIS — N888 Other specified noninflammatory disorders of cervix uteri: Secondary | ICD-10-CM

## 2013-03-04 DIAGNOSIS — N946 Dysmenorrhea, unspecified: Secondary | ICD-10-CM

## 2013-03-04 DIAGNOSIS — Z91038 Other insect allergy status: Secondary | ICD-10-CM | POA: Insufficient documentation

## 2013-03-04 DIAGNOSIS — N938 Other specified abnormal uterine and vaginal bleeding: Secondary | ICD-10-CM

## 2013-03-04 DIAGNOSIS — Z888 Allergy status to other drugs, medicaments and biological substances status: Secondary | ICD-10-CM | POA: Insufficient documentation

## 2013-03-04 DIAGNOSIS — Z90710 Acquired absence of both cervix and uterus: Secondary | ICD-10-CM | POA: Diagnosis present

## 2013-03-04 DIAGNOSIS — F1920 Other psychoactive substance dependence, uncomplicated: Secondary | ICD-10-CM | POA: Insufficient documentation

## 2013-03-04 DIAGNOSIS — N949 Unspecified condition associated with female genital organs and menstrual cycle: Secondary | ICD-10-CM

## 2013-03-04 HISTORY — PX: VAGINAL HYSTERECTOMY: SHX2639

## 2013-03-04 SURGERY — HYSTERECTOMY, VAGINAL
Anesthesia: General | Site: Vagina

## 2013-03-04 MED ORDER — DOCUSATE SODIUM 100 MG PO CAPS
100.0000 mg | ORAL_CAPSULE | Freq: Two times a day (BID) | ORAL | Status: DC
  Administered 2013-03-04: 100 mg via ORAL
  Filled 2013-03-04: qty 1

## 2013-03-04 MED ORDER — OXYCODONE-ACETAMINOPHEN 5-325 MG PO TABS
1.0000 | ORAL_TABLET | ORAL | Status: DC | PRN
  Administered 2013-03-04: 2 via ORAL
  Filled 2013-03-04: qty 2

## 2013-03-04 MED ORDER — 0.9 % SODIUM CHLORIDE (POUR BTL) OPTIME
TOPICAL | Status: DC | PRN
  Administered 2013-03-04: 500 mL

## 2013-03-04 MED ORDER — LACTATED RINGERS IV SOLN
INTRAVENOUS | Status: DC
  Administered 2013-03-04: 1000 mL via INTRAVENOUS

## 2013-03-04 MED ORDER — NEOSTIGMINE METHYLSULFATE 1 MG/ML IJ SOLN
INTRAMUSCULAR | Status: AC
  Filled 2013-03-04: qty 1

## 2013-03-04 MED ORDER — MIDAZOLAM HCL 2 MG/2ML IJ SOLN
INTRAMUSCULAR | Status: AC
  Filled 2013-03-04: qty 2

## 2013-03-04 MED ORDER — GLYCOPYRROLATE 0.2 MG/ML IJ SOLN
0.2000 mg | Freq: Once | INTRAMUSCULAR | Status: AC
  Administered 2013-03-04: 0.2 mg via INTRAVENOUS
  Filled 2013-03-04: qty 1

## 2013-03-04 MED ORDER — ONDANSETRON 8 MG/NS 50 ML IVPB
8.0000 mg | Freq: Four times a day (QID) | INTRAVENOUS | Status: DC | PRN
  Filled 2013-03-04: qty 8

## 2013-03-04 MED ORDER — HYDROMORPHONE HCL PF 1 MG/ML IJ SOLN
1.0000 mg | INTRAMUSCULAR | Status: DC | PRN
  Administered 2013-03-04: 2 mg via INTRAVENOUS
  Administered 2013-03-04: 1 mg via INTRAVENOUS
  Administered 2013-03-04: 2 mg via INTRAVENOUS
  Filled 2013-03-04: qty 2
  Filled 2013-03-04: qty 1
  Filled 2013-03-04: qty 2

## 2013-03-04 MED ORDER — PROPOFOL 10 MG/ML IV BOLUS
INTRAVENOUS | Status: DC | PRN
  Administered 2013-03-04: 140 mg via INTRAVENOUS

## 2013-03-04 MED ORDER — ONDANSETRON HCL 4 MG/2ML IJ SOLN
INTRAMUSCULAR | Status: AC
  Filled 2013-03-04: qty 2

## 2013-03-04 MED ORDER — ALPRAZOLAM 0.5 MG PO TABS
0.5000 mg | ORAL_TABLET | Freq: Three times a day (TID) | ORAL | Status: DC
  Administered 2013-03-04: 0.5 mg via ORAL
  Filled 2013-03-04: qty 1

## 2013-03-04 MED ORDER — MIDAZOLAM HCL 5 MG/5ML IJ SOLN
INTRAMUSCULAR | Status: DC | PRN
  Administered 2013-03-04: 2 mg via INTRAVENOUS

## 2013-03-04 MED ORDER — CLINDAMYCIN PHOSPHATE 900 MG/50ML IV SOLN
900.0000 mg | INTRAVENOUS | Status: AC
  Administered 2013-03-04: 900 mg via INTRAVENOUS
  Filled 2013-03-04: qty 50

## 2013-03-04 MED ORDER — FENTANYL CITRATE 0.05 MG/ML IJ SOLN
25.0000 ug | INTRAMUSCULAR | Status: DC | PRN
  Administered 2013-03-04: 25 ug via INTRAVENOUS

## 2013-03-04 MED ORDER — KCL IN DEXTROSE-NACL 20-5-0.45 MEQ/L-%-% IV SOLN
INTRAVENOUS | Status: DC
  Administered 2013-03-04: 12:00:00 via INTRAVENOUS

## 2013-03-04 MED ORDER — SUCCINYLCHOLINE CHLORIDE 20 MG/ML IJ SOLN
INTRAMUSCULAR | Status: AC
  Filled 2013-03-04: qty 1

## 2013-03-04 MED ORDER — LIDOCAINE HCL (PF) 1 % IJ SOLN
INTRAMUSCULAR | Status: AC
  Filled 2013-03-04: qty 5

## 2013-03-04 MED ORDER — GLYCOPYRROLATE 0.2 MG/ML IJ SOLN
INTRAMUSCULAR | Status: AC
  Filled 2013-03-04: qty 3

## 2013-03-04 MED ORDER — NEOSTIGMINE METHYLSULFATE 1 MG/ML IJ SOLN
INTRAMUSCULAR | Status: DC | PRN
  Administered 2013-03-04: 4 mg via INTRAVENOUS

## 2013-03-04 MED ORDER — GLYCOPYRROLATE 0.2 MG/ML IJ SOLN
INTRAMUSCULAR | Status: DC | PRN
  Administered 2013-03-04: 0.6 mg via INTRAVENOUS

## 2013-03-04 MED ORDER — FENTANYL CITRATE 0.05 MG/ML IJ SOLN
INTRAMUSCULAR | Status: AC
  Filled 2013-03-04: qty 2

## 2013-03-04 MED ORDER — ALUM & MAG HYDROXIDE-SIMETH 200-200-20 MG/5ML PO SUSP
30.0000 mL | ORAL | Status: DC | PRN
  Administered 2013-03-04: 30 mL via ORAL
  Filled 2013-03-04: qty 30

## 2013-03-04 MED ORDER — OXYCODONE-ACETAMINOPHEN 5-325 MG PO TABS: 1.0000 | ORAL_TABLET | ORAL | Status: DC | PRN

## 2013-03-04 MED ORDER — CIPROFLOXACIN IN D5W 400 MG/200ML IV SOLN
400.0000 mg | INTRAVENOUS | Status: AC
  Administered 2013-03-04: 400 mg via INTRAVENOUS
  Filled 2013-03-04: qty 200

## 2013-03-04 MED ORDER — ONDANSETRON HCL 4 MG/2ML IJ SOLN: 4.0000 mg | Freq: Once | INTRAMUSCULAR | Status: DC | PRN

## 2013-03-04 MED ORDER — ONDANSETRON HCL 4 MG/2ML IJ SOLN
4.0000 mg | Freq: Once | INTRAMUSCULAR | Status: AC
  Administered 2013-03-04: 4 mg via INTRAVENOUS

## 2013-03-04 MED ORDER — SUFENTANIL CITRATE 50 MCG/ML IV SOLN
INTRAVENOUS | Status: AC
  Filled 2013-03-04: qty 1

## 2013-03-04 MED ORDER — PROPOFOL 10 MG/ML IV BOLUS
INTRAVENOUS | Status: AC
  Filled 2013-03-04: qty 20

## 2013-03-04 MED ORDER — KETOROLAC TROMETHAMINE 30 MG/ML IJ SOLN
30.0000 mg | Freq: Once | INTRAMUSCULAR | Status: AC
  Administered 2013-03-04: 30 mg via INTRAVENOUS
  Filled 2013-03-04: qty 1

## 2013-03-04 MED ORDER — KETOROLAC TROMETHAMINE 10 MG PO TABS: 10.0000 mg | ORAL_TABLET | Freq: Three times a day (TID) | ORAL | Status: DC | PRN

## 2013-03-04 MED ORDER — SUFENTANIL CITRATE 50 MCG/ML IV SOLN
INTRAVENOUS | Status: DC | PRN
  Administered 2013-03-04 (×5): 10 ug via INTRAVENOUS

## 2013-03-04 MED ORDER — LACTATED RINGERS IV SOLN
INTRAVENOUS | Status: DC | PRN
  Administered 2013-03-04 (×3): via INTRAVENOUS

## 2013-03-04 MED ORDER — ALBUTEROL SULFATE (2.5 MG/3ML) 0.083% IN NEBU: 0.2500 mg | INHALATION_SOLUTION | Freq: Four times a day (QID) | RESPIRATORY_TRACT | Status: DC | PRN

## 2013-03-04 MED ORDER — LIDOCAINE HCL (CARDIAC) 20 MG/ML IV SOLN
INTRAVENOUS | Status: DC | PRN
  Administered 2013-03-04: 50 mg via INTRAVENOUS

## 2013-03-04 MED ORDER — SUCCINYLCHOLINE CHLORIDE 20 MG/ML IJ SOLN
INTRAMUSCULAR | Status: DC | PRN
  Administered 2013-03-04: 100 mg via INTRAVENOUS

## 2013-03-04 MED ORDER — CIPROFLOXACIN HCL 500 MG PO TABS: 500.0000 mg | ORAL_TABLET | Freq: Two times a day (BID) | ORAL | Status: DC

## 2013-03-04 MED ORDER — ARTIFICIAL TEARS OP OINT
TOPICAL_OINTMENT | OPHTHALMIC | Status: DC | PRN
  Administered 2013-03-04: 1 via OPHTHALMIC

## 2013-03-04 MED ORDER — BUPIVACAINE-EPINEPHRINE (PF) 0.5% -1:200000 IJ SOLN
INTRAMUSCULAR | Status: DC | PRN
  Administered 2013-03-04: 8 mL

## 2013-03-04 MED ORDER — ONDANSETRON HCL 4 MG PO TABS
8.0000 mg | ORAL_TABLET | Freq: Four times a day (QID) | ORAL | Status: DC | PRN
  Administered 2013-03-04: 8 mg via ORAL
  Filled 2013-03-04: qty 2

## 2013-03-04 MED ORDER — ALBUTEROL SULFATE (2.5 MG/3ML) 0.083% IN NEBU: 2.5000 mg | INHALATION_SOLUTION | Freq: Four times a day (QID) | RESPIRATORY_TRACT | Status: DC | PRN

## 2013-03-04 MED ORDER — ROCURONIUM BROMIDE 50 MG/5ML IV SOLN
INTRAVENOUS | Status: AC
  Filled 2013-03-04: qty 1

## 2013-03-04 MED ORDER — MIDAZOLAM HCL 2 MG/2ML IJ SOLN
1.0000 mg | INTRAMUSCULAR | Status: DC | PRN
  Administered 2013-03-04: 2 mg via INTRAVENOUS

## 2013-03-04 MED ORDER — SODIUM CHLORIDE BACTERIOSTATIC 0.9 % IJ SOLN
INTRAMUSCULAR | Status: AC
  Filled 2013-03-04: qty 10

## 2013-03-04 MED ORDER — ROCURONIUM BROMIDE 100 MG/10ML IV SOLN
INTRAVENOUS | Status: DC | PRN
  Administered 2013-03-04: 10 mg via INTRAVENOUS
  Administered 2013-03-04: 30 mg via INTRAVENOUS

## 2013-03-04 MED ORDER — SODIUM CHLORIDE 0.9 % IR SOLN
Status: DC | PRN
  Administered 2013-03-04: 3000 mL

## 2013-03-04 MED ORDER — BUPIVACAINE-EPINEPHRINE PF 0.5-1:200000 % IJ SOLN
INTRAMUSCULAR | Status: AC
  Filled 2013-03-04: qty 10

## 2013-03-04 MED ORDER — ONDANSETRON HCL 8 MG PO TABS: 8.0000 mg | ORAL_TABLET | Freq: Four times a day (QID) | ORAL | Status: DC | PRN

## 2013-03-04 SURGICAL SUPPLY — 39 items
APPLIER CLIP 13 LRG OPEN (CLIP)
APR CLP LRG 13 20 CLIP (CLIP)
BAG HAMPER (MISCELLANEOUS) ×4 IMPLANT
CLIP APPLIE 13 LRG OPEN (CLIP) IMPLANT
CLOTH BEACON ORANGE TIMEOUT ST (SAFETY) ×4 IMPLANT
COVER LIGHT HANDLE STERIS (MISCELLANEOUS) ×8 IMPLANT
COVER MAYO STAND XLG (DRAPE) ×4 IMPLANT
DECANTER SPIKE VIAL GLASS SM (MISCELLANEOUS) ×4 IMPLANT
DRAPE PROXIMA HALF (DRAPES) ×4 IMPLANT
DRAPE STERI URO 9X17 APER PCH (DRAPES) ×4 IMPLANT
ELECT REM PT RETURN 9FT ADLT (ELECTROSURGICAL) ×4
ELECTRODE REM PT RTRN 9FT ADLT (ELECTROSURGICAL) ×2 IMPLANT
FORMALIN 10 PREFIL 480ML (MISCELLANEOUS) ×4 IMPLANT
GAUZE PACKING 2X5 YD STRL (GAUZE/BANDAGES/DRESSINGS) ×1 IMPLANT
GLOVE BIOGEL PI IND STRL 8 (GLOVE) ×2 IMPLANT
GLOVE BIOGEL PI INDICATOR 8 (GLOVE) ×2
GLOVE ECLIPSE 7.0 STRL STRAW (GLOVE) ×6 IMPLANT
GLOVE ECLIPSE 8.0 STRL XLNG CF (GLOVE) ×4 IMPLANT
GLOVE EXAM NITRILE PF SM BLUE (GLOVE) ×3 IMPLANT
GLOVE INDICATOR 7.0 STRL GRN (GLOVE) ×6 IMPLANT
GOWN STRL REUS W/TWL LRG LVL3 (GOWN DISPOSABLE) ×9 IMPLANT
GOWN STRL REUS W/TWL XL LVL3 (GOWN DISPOSABLE) ×4 IMPLANT
IV NS IRRIG 3000ML ARTHROMATIC (IV SOLUTION) ×4 IMPLANT
KIT ROOM TURNOVER AP CYSTO (KITS) ×4 IMPLANT
MANIFOLD NEPTUNE II (INSTRUMENTS) ×4 IMPLANT
NEEDLE HYPO 22GX1.5 SAFETY (NEEDLE) ×4 IMPLANT
NS IRRIG 1000ML POUR BTL (IV SOLUTION) ×4 IMPLANT
PACK PERI GYN (CUSTOM PROCEDURE TRAY) ×4 IMPLANT
PAD ARMBOARD 7.5X6 YLW CONV (MISCELLANEOUS) ×4 IMPLANT
SET BASIN LINEN APH (SET/KITS/TRAYS/PACK) ×4 IMPLANT
SUT MNCRL+ AB 3-0 CT1 36 (SUTURE) ×1 IMPLANT
SUT MON AB 3-0 SH 27 (SUTURE) ×3 IMPLANT
SUT MONOCRYL AB 3-0 CT1 36IN (SUTURE)
SUT VIC AB 0 CT1 27 (SUTURE) ×16
SUT VIC AB 0 CT1 27XBRD ANTBC (SUTURE) ×2 IMPLANT
SUT VIC AB 0 CT1 27XCR 8 STRN (SUTURE) ×5 IMPLANT
SYR CONTROL 10ML LL (SYRINGE) ×4 IMPLANT
TRAY FOLEY CATH 16FR SILVER (SET/KITS/TRAYS/PACK) ×4 IMPLANT
VERSALIGHT (MISCELLANEOUS) ×4 IMPLANT

## 2013-03-04 NOTE — Anesthesia Preprocedure Evaluation (Signed)
Anesthesia Evaluation  Patient identified by MRN, date of birth, ID band Patient awake    Reviewed: Allergy & Precautions, H&P , NPO status , Patient's Chart, lab work & pertinent test results  Airway Mallampati: I TM Distance: >3 FB     Dental  (+) Edentulous Upper, Partial Lower   Pulmonary COPDCurrent Smoker,  breath sounds clear to auscultation        Cardiovascular negative cardio ROS  Rhythm:Regular Rate:Normal     Neuro/Psych  Headaches, PSYCHIATRIC DISORDERS Anxiety Depression    GI/Hepatic GERD-  Controlled and Medicated,  Endo/Other    Renal/GU      Musculoskeletal   Abdominal   Peds  Hematology   Anesthesia Other Findings Hx narcotic dependence.  Reproductive/Obstetrics                           Anesthesia Physical Anesthesia Plan  ASA: III  Anesthesia Plan: General   Post-op Pain Management:    Induction: Intravenous, Rapid sequence and Cricoid pressure planned  Airway Management Planned: Oral ETT  Additional Equipment:   Intra-op Plan:   Post-operative Plan: Extubation in OR  Informed Consent: I have reviewed the patients History and Physical, chart, labs and discussed the procedure including the risks, benefits and alternatives for the proposed anesthesia with the patient or authorized representative who has indicated his/her understanding and acceptance.     Plan Discussed with:   Anesthesia Plan Comments:         Anesthesia Quick Evaluation

## 2013-03-04 NOTE — Preoperative (Signed)
Beta Blockers   Reason not to administer Beta Blockers:Not Applicable 

## 2013-03-04 NOTE — Anesthesia Postprocedure Evaluation (Signed)
  Anesthesia Post-op Note  Patient: Shannon ReichertMelissa D Reese  Procedure(s) Performed: Procedure(s): HYSTERECTOMY VAGINAL (N/A)  Patient Location: PACU  Anesthesia Type:General  Level of Consciousness: sedated and patient cooperative  Airway and Oxygen Therapy: Patient Spontanous Breathing and Patient connected to face mask oxygen  Post-op Pain: mild  Post-op Assessment: Post-op Vital signs reviewed, Patient's Cardiovascular Status Stable, Respiratory Function Stable, Patent Airway, No signs of Nausea or vomiting and Pain level controlled  Post-op Vital Signs: Reviewed and stable  Complications: No apparent anesthesia complications

## 2013-03-04 NOTE — Discharge Summary (Signed)
Physician Discharge Summary  Patient ID: Shannon Reese MRN: [id number] DOB/AGE: 38-Nov-1977 37 y.o.  Admit date: 03/04/2013 Discharge date: 03/04/2013  Admission Diagnoses: Dyspareunia Menometrorrhagia dysmenorrhea Discharge Diagnoses:  Active Problems:   S/P vaginal hysterectomy   Discharged Condition: good  Hospital Course: unremarkable  Consults: None  Significant Diagnostic Studies: none  Treatments: surgery: tvh  Discharge Exam: Blood pressure 101/63, pulse 62, temperature 97.8 F (36.6 C), temperature source Oral, resp. rate 18, height 5\' 10"  (1.778 m), weight 212 lb (96.163 kg), last menstrual period 02/22/2013, SpO2 100.00%. General appearance: alert, cooperative and no distress GI: soft, non-tender; bowel sounds normal; no masses,  no organomegaly  Disposition: 01-Home or Self Care  Discharge Orders   Future Appointments Provider Department Dept Phone   03/11/2013 4:00 PM Lazaro ArmsLuther H Renne Cornick, MD [facility] [phone]   Future Orders Complete By Expires   Call MD for:  persistant nausea and vomiting  As directed    Call MD for:  severe uncontrolled pain  As directed    Call MD for:  temperature >100.4  As directed    Diet - low sodium heart healthy  As directed    Increase activity slowly  As directed    Lifting restrictions  As directed    Comments:     No lifting more than 8 pounds   Sexual Activity Restrictions  As directed    Comments:     You got to be kidding!!!!       Medication List    STOP taking these medications       acetaminophen 500 MG tablet  Commonly known as:  TYLENOL      TAKE these medications       albuterol 108 (90 BASE) MCG/ACT inhaler  Commonly known as:  PROAIR HFA  Inhale 2 puffs into the lungs every 6 (six) hours as needed for wheezing or shortness of breath.     ALPRAZolam 0.5 MG tablet  Commonly known as:  XANAX  TAKE (1) TABLET BY MOUTH (3) TIMES DAILY AS NEEDED FOR ANXIETY/SLEEP.     ciprofloxacin 500 MG  tablet  Commonly known as:  CIPRO  Take 1 tablet (500 mg total) by mouth 2 (two) times daily.     ketorolac 10 MG tablet  Commonly known as:  TORADOL  Take 1 tablet (10 mg total) by mouth every 8 (eight) hours as needed.     omeprazole 40 MG capsule  Commonly known as:  PRILOSEC  Take 1 capsule (40 mg total) by mouth daily.     ondansetron 8 MG tablet  Commonly known as:  ZOFRAN  Take 1 tablet (8 mg total) by mouth every 6 (six) hours as needed for nausea.     oxyCODONE-acetaminophen 5-325 MG per tablet  Commonly known as:  PERCOCET/ROXICET  Take 1-2 tablets by mouth every 4 (four) hours as needed for severe pain (moderate to severe pain (when tolerating fluids)).     VISINE OP  Place 1 drop into both eyes daily as needed (dry eyes).           Follow-up Information   Follow up with Lazaro ArmsEURE,Chanise Habeck H, MD In 1 week.   Specialties:  Obstetrics and Gynecology, Radiology   Contact information:   [address] [place] [address] [phone]       Signed: Lazaro ArmsURE,Kouper Spinella H 03/04/2013, 5:53 PM
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
## 2013-03-04 NOTE — Discharge Instructions (Signed)
Laparoscopically Assisted Vaginal Hysterectomy  A laparoscopically assisted vaginal hysterectomy (LAVH) is a surgical procedure to remove the uterus and cervix, and sometimes the ovaries and fallopian tubes. During an LAVH, some of the surgical removal is done through the vagina, and the rest is done through a few small surgical cuts (incisions) in the abdomen.  This procedure is usually considered in women when a vaginal hysterectomy is not an option. Your health care provider will discuss the risks and benefits of the different surgical techniques at your appointment. Generally, recovery time is faster and there are fewer complications after laparoscopic procedures than after open incisional procedures. LET YOUR HEALTH CARE PROVIDER KNOW ABOUT:   Any allergies you have.  All medicines you are taking, including vitamins, herbs, eye drops, creams, and over-the-counter medicines.  Previous problems you or members of your family have had with the use of anesthetics.  Any blood disorders you have.  Previous surgeries you have had.  Medical conditions you have. RISKS AND COMPLICATIONS Generally, this is a safe procedure. However, as with any procedure, complications can occur. Possible complications include:  Allergies to medicines.  Difficulty breathing.  Bleeding.  Infection.  Damage to other structures near your uterus and cervix. BEFORE THE PROCEDURE  Ask your health care provider about changing or stopping your regular medicines.  Take certain medicines, such as a colon-emptying preparation, as directed.  Do not eat or drink anything for at least 8 hours before your surgery.  Stop smoking if you smoke. Stopping will improve your health after surgery.  Arrange for a ride home after surgery and for help at home during recovery. PROCEDURE   An IV tube will be put into one of your veins in order to give you fluids and medicines.  You will receive medicines to relax you and  medicines that make you sleep (general anesthetic).  You may have a flexible tube (catheter) put into your bladder to drain urine.  You may have a tube put through your nose or mouth that goes into your stomach (nasogastric tube). The nasogastric tube removes digestive fluids and prevents you from feeling nauseated and from vomiting.  Tight-fitting (compression) stockings will be placed on your legs to promote circulation.  Three to four small incisions will be made in your abdomen. An incision also will be made in your vagina. Probes and tools will be inserted into the small incisions. The uterus and cervix are removed (and possibly your ovaries and fallopian tubes) through your vagina as well as through the small incisions that were made in the abdomen.  Your vagina is then sewn back to normal. AFTER THE PROCEDURE  You may have a liquid diet temporarily. You will most likely return to, and tolerate, your usual diet the day after surgery.  You will be passing urine through a catheter. It will be removed the day after surgery.  Your temperature, breathing rate, heart rate, blood pressure, and oxygen level will be monitored regularly.  You will still wear compression stockings on your legs until you are able to move around.  You will use a special device or do breathing exercises to keep your lungs clear.  You will be encouraged to walk as soon as possible. Document Released: 12/14/2010 Document Revised: 08/27/2012 Document Reviewed: 07/10/2012 [employer] Patient Information 2014 [employer].  

## 2013-03-04 NOTE — Op Note (Signed)
Preoperative diagnosis:  1.  Bump dyspareunia                                         2.  DUB controlled on megestrol                                         3.  Adenomyosis                                         Postoperative diagnosis:  Same as above   Procedure:  Vaginal hysterectomy  Surgeon:  Lazaro ArmsLuther H Ida Uppal MD  Anesthesia:  General Endotracheal  Findings:  Normal uterus tubes and ovaries, no intraperitoneal abnormalities   Description of operation:  The patient was taken from the preoperative area to the operating room in stable condition. She was placed in the sitting position and underwent a spinal anesthetic. Once an adequate level of anesthesia was attained she was placed in the dorsal lithotomy position. Patient was prepped and draped in the usual sterile fashion and a Foley catheter was placed.  A weighted speculum was placed and the cervix was grasped with thyroid tenaculums both anteriorly and posteriorly.  8 cc of 0.5% Marcaine plain was injected in a circumferential fashion about the cervix and the electrocautery unit was used to incise the vagina and push off thecervix.  The posterior cul-de-sac was then entered sharply without difficulty.  The uterosacral ligaments were clamped cut and transfixtion suture ligated and held.  The cardinal ligaments were then clamped cut transfixion suture ligated and cut. The anterior peritoneum was identified the anterior cul-de-sac was entered sharply without difficulty. The anterior and posterior leaves of the broad ligament were plicated and the uterine vessels were clamped cut and suture ligated. Serial pedicles were taken of the fundus with each pedicle being clamped cut and suture ligated. The utero-ovarian ligaments were crossclamped the uterus was removed and both pedicles were transfixion suture ligated. Large hemoclips were placed to ensure secure peritoneal surface of the pedicles.  There was good hemostasis of all the pedicles. The  peritoneum was then closed in a pursestring fashion using 3-0 monocryl. The anterior and posterior vagina was closed in interrupted fashion with good resultant hemostasis. Prior to closure the lower pelvis and vagina were irrigated vigorously.  The sponge needle and instrument counts were correct x 3.  Total blood loss for the procedure was 100 cc.  The patient received Ciprofloxacin and cleocin(PCN allergy)f and 30 mg of Toradol IV preoperatively prophylactically.  She was taken to the recovery room in good stable condition awake alert doing well.  The tubes could not be easily removed  Shannon Reese H 03/04/2013, 10:26 AM

## 2013-03-04 NOTE — Transfer of Care (Signed)
Immediate Anesthesia Transfer of Care Note  Patient: Shannon Reese  Procedure(s) Performed: Procedure(s): HYSTERECTOMY VAGINAL (N/A)  Patient Location: PACU  Anesthesia Type:General  Level of Consciousness: sedated and patient cooperative  Airway & Oxygen Therapy: Patient Spontanous Breathing and Patient connected to face mask oxygen  Post-op Assessment: Report given to PACU RN and Post -op Vital signs reviewed and stable  Post vital signs: Reviewed and stable  Complications: No apparent anesthesia complications

## 2013-03-04 NOTE — Plan of Care (Signed)
Problem: Discharge Progression Outcomes Goal: Activity appropriate for discharge plan Outcome: Completed/Met Date Met:  03/04/13 Ambulating in halls

## 2013-03-04 NOTE — Anesthesia Procedure Notes (Signed)
Procedure Name: Intubation Date/Time: 03/04/2013 8:38 AM Performed by: Pernell DupreADAMS, Chancie Lampert A Pre-anesthesia Checklist: Patient identified, Patient being monitored, Timeout performed, Emergency Drugs available and Suction available Patient Re-evaluated:Patient Re-evaluated prior to inductionOxygen Delivery Method: Circle System Utilized Preoxygenation: Pre-oxygenation with 100% oxygen Intubation Type: IV induction, Cricoid Pressure applied and Rapid sequence Laryngoscope Size: 3 and Miller Grade View: Grade I Tube type: Oral Tube size: 7.0 mm Number of attempts: 1 Airway Equipment and Method: stylet Placement Confirmation: ETT inserted through vocal cords under direct vision,  positive ETCO2 and breath sounds checked- equal and bilateral Secured at: 21 cm Tube secured with: Tape Dental Injury: Teeth and Oropharynx as per pre-operative assessment

## 2013-03-05 NOTE — Progress Notes (Signed)
UR chart review completed.  

## 2013-03-06 ENCOUNTER — Telehealth: Payer: Self-pay

## 2013-03-06 ENCOUNTER — Encounter (HOSPITAL_COMMUNITY): Payer: Self-pay | Admitting: Obstetrics & Gynecology

## 2013-03-06 NOTE — Telephone Encounter (Signed)
Pt states had vaginal hysterectomy on 03/04/2013, has post op appt 03/11/2013, states will need refill on pain meds before this appt.  1.  requesting refill on percocet.   Pt informed Dr. Despina HiddenEure out of office until Monday, will route message to him, pt states can get thru weekend with pain meds she has now.

## 2013-03-09 MED ORDER — OXYCODONE-ACETAMINOPHEN 5-325 MG PO TABS: 1.0000 | ORAL_TABLET | ORAL | Status: DC | PRN

## 2013-03-10 NOTE — Telephone Encounter (Signed)
Pt aware RX for percocet at front desk.

## 2013-03-11 ENCOUNTER — Ambulatory Visit (INDEPENDENT_AMBULATORY_CARE_PROVIDER_SITE_OTHER): Payer: Self-pay | Admitting: Obstetrics & Gynecology

## 2013-03-11 ENCOUNTER — Encounter: Payer: Self-pay | Admitting: Obstetrics & Gynecology

## 2013-03-11 VITALS — BP 110/70 | Wt 211.2 lb

## 2013-03-11 DIAGNOSIS — Z90710 Acquired absence of both cervix and uterus: Secondary | ICD-10-CM

## 2013-03-11 DIAGNOSIS — Z9889 Other specified postprocedural states: Secondary | ICD-10-CM

## 2013-03-11 NOTE — Progress Notes (Signed)
Patient ID: Shannon ReichertMelissa D Reese, female   DOB: 07/05/1975, 38 y.o.   MRN: [id number] POD#8 vaginal hysterectomy Doing well No complaints Minimal spotting  Exam normal  Follow up 4 weeks No sex or put anything in vagina  Past Medical History  Diagnosis Date  . Arthritis   . Low back pain   . COPD (chronic obstructive pulmonary disease)   . GERD (gastroesophageal reflux disease)   . Bipolar disorder   . Anxiety   . Facet joint disease MRI 2012  . Chronic pain   . Narcotic abuse     Drug seeking behavior    Past Surgical History  Procedure Laterality Date  . Tubal ligation    . Dental surgery    . Cryoablation    . Vaginal hysterectomy N/A 03/04/2013    Procedure: HYSTERECTOMY VAGINAL;  Surgeon: Lazaro ArmsLuther H Eure, MD;  Location: [facility];  Service: Gynecology;  Laterality: N/A;    OB History   Grav Para Term Preterm Abortions TAB SAB Ect Mult Living   2 2 2       2       Allergies  Allergen Reactions  . Penicillins Anaphylaxis  . Bee Venom Swelling    Entire body swells  . Cephalexin Hives and Swelling  . Lansoprazole Hives and Swelling  . Tramadol Palpitations    History   Social History  . Marital Status: Single    Spouse Name: N/A    Number of Children: N/A  . Years of Education: N/A   Social History Main Topics  . Smoking status: Current Every Day Smoker -- 1.00 packs/day for 21 years    Types: Cigarettes  . Smokeless tobacco: Never Used  . Alcohol Use: No  . Drug Use: No  . Sexual Activity: Not Currently    Birth Control/ Protection: Surgical   Other Topics Concern  . None   Social History Narrative  . None    Family History  Problem Relation Age of Onset  . Depression Mother   . Arthritis Mother   . Heart disease Father   . Diabetes Father   . COPD Father   . COPD Sister   . Arthritis Sister   . Alcohol abuse Sister   . Hypertension Brother   . Diabetes Brother

## 2013-03-16 ENCOUNTER — Emergency Department (HOSPITAL_COMMUNITY)
Admission: EM | Admit: 2013-03-16 | Discharge: 2013-03-16 | Disposition: A | Discharge status: Home / Self Care | Payer: Medicaid Other | Attending: Emergency Medicine | Admitting: Emergency Medicine

## 2013-03-16 ENCOUNTER — Encounter (HOSPITAL_COMMUNITY): Payer: Self-pay | Admitting: Emergency Medicine

## 2013-03-16 DIAGNOSIS — M129 Arthropathy, unspecified: Secondary | ICD-10-CM | POA: Insufficient documentation

## 2013-03-16 DIAGNOSIS — F17200 Nicotine dependence, unspecified, uncomplicated: Secondary | ICD-10-CM | POA: Insufficient documentation

## 2013-03-16 DIAGNOSIS — F411 Generalized anxiety disorder: Secondary | ICD-10-CM | POA: Insufficient documentation

## 2013-03-16 DIAGNOSIS — Z880 Allergy status to penicillin: Secondary | ICD-10-CM | POA: Insufficient documentation

## 2013-03-16 DIAGNOSIS — Z9851 Tubal ligation status: Secondary | ICD-10-CM | POA: Insufficient documentation

## 2013-03-16 DIAGNOSIS — Z79899 Other long term (current) drug therapy: Secondary | ICD-10-CM | POA: Insufficient documentation

## 2013-03-16 DIAGNOSIS — F319 Bipolar disorder, unspecified: Secondary | ICD-10-CM | POA: Insufficient documentation

## 2013-03-16 DIAGNOSIS — R350 Frequency of micturition: Secondary | ICD-10-CM | POA: Insufficient documentation

## 2013-03-16 DIAGNOSIS — K219 Gastro-esophageal reflux disease without esophagitis: Secondary | ICD-10-CM | POA: Insufficient documentation

## 2013-03-16 DIAGNOSIS — J4489 Other specified chronic obstructive pulmonary disease: Secondary | ICD-10-CM | POA: Insufficient documentation

## 2013-03-16 DIAGNOSIS — J449 Chronic obstructive pulmonary disease, unspecified: Secondary | ICD-10-CM | POA: Insufficient documentation

## 2013-03-16 DIAGNOSIS — Z8739 Personal history of other diseases of the musculoskeletal system and connective tissue: Secondary | ICD-10-CM | POA: Insufficient documentation

## 2013-03-16 DIAGNOSIS — Z792 Long term (current) use of antibiotics: Secondary | ICD-10-CM | POA: Insufficient documentation

## 2013-03-16 DIAGNOSIS — G8918 Other acute postprocedural pain: Secondary | ICD-10-CM | POA: Insufficient documentation

## 2013-03-16 DIAGNOSIS — G8929 Other chronic pain: Secondary | ICD-10-CM | POA: Insufficient documentation

## 2013-03-16 DIAGNOSIS — N898 Other specified noninflammatory disorders of vagina: Secondary | ICD-10-CM | POA: Insufficient documentation

## 2013-03-16 DIAGNOSIS — R109 Unspecified abdominal pain: Secondary | ICD-10-CM | POA: Insufficient documentation

## 2013-03-16 LAB — CBC WITH DIFFERENTIAL/PLATELET
Basophils Absolute: 0 10*3/uL (ref 0.0–0.1)
Basophils Relative: 0 % (ref 0–1)
EOS ABS: 0.2 10*3/uL (ref 0.0–0.7)
Eosinophils Relative: 3 % (ref 0–5)
HCT: 37.4 % (ref 36.0–46.0)
HEMOGLOBIN: 12.9 g/dL (ref 12.0–15.0)
LYMPHS ABS: 2.1 10*3/uL (ref 0.7–4.0)
LYMPHS PCT: 28 % (ref 12–46)
MCH: 31.9 pg (ref 26.0–34.0)
MCHC: 34.5 g/dL (ref 30.0–36.0)
MCV: 92.6 fL (ref 78.0–100.0)
Monocytes Absolute: 0.3 10*3/uL (ref 0.1–1.0)
Monocytes Relative: 5 % (ref 3–12)
NEUTROS PCT: 65 % (ref 43–77)
Neutro Abs: 4.8 10*3/uL (ref 1.7–7.7)
Platelets: 239 10*3/uL (ref 150–400)
RBC: 4.04 MIL/uL (ref 3.87–5.11)
RDW: 13.1 % (ref 11.5–15.5)
WBC: 7.4 10*3/uL (ref 4.0–10.5)

## 2013-03-16 LAB — BASIC METABOLIC PANEL
BUN: 17 mg/dL (ref 6–23)
CO2: 27 meq/L (ref 19–32)
Calcium: 9 mg/dL (ref 8.4–10.5)
Chloride: 102 mEq/L (ref 96–112)
Creatinine, Ser: 0.64 mg/dL (ref 0.50–1.10)
GFR calc Af Amer: >90 mL/min (ref >90)
GFR calc non Af Amer: >90 mL/min (ref >90)
GLUCOSE: 127 mg/dL — AB (ref 70–99)
Potassium: 3.9 mEq/L (ref 3.7–5.3)
Sodium: 140 mEq/L (ref 137–147)

## 2013-03-16 LAB — URINALYSIS, ROUTINE W REFLEX MICROSCOPIC
BILIRUBIN URINE: NEGATIVE
GLUCOSE, UA: NEGATIVE mg/dL
Ketones, ur: NEGATIVE mg/dL
Nitrite: NEGATIVE
PH: 7 (ref 5.0–8.0)
Protein, ur: NEGATIVE mg/dL
Specific Gravity, Urine: 1.01 (ref 1.005–1.030)
Urobilinogen, UA: 0.2 mg/dL (ref 0.0–1.0)

## 2013-03-16 LAB — URINE MICROSCOPIC-ADD ON

## 2013-03-16 MED ORDER — HYDROCODONE-ACETAMINOPHEN 5-325 MG PO TABS
2.0000 | ORAL_TABLET | Freq: Once | ORAL | Status: AC
  Administered 2013-03-16: 2 via ORAL
  Filled 2013-03-16: qty 2

## 2013-03-16 MED ORDER — OXYCODONE-ACETAMINOPHEN 5-325 MG PO TABS: 2.0000 | ORAL_TABLET | Freq: Once | ORAL | Status: DC

## 2013-03-16 MED ORDER — HYDROCODONE-ACETAMINOPHEN 5-325 MG PO TABS: 2.0000 | ORAL_TABLET | ORAL | Status: DC | PRN

## 2013-03-16 NOTE — ED Notes (Signed)
Pt states vaginal hysterectomy 2/28. States abdominal cramping and lower back pain. Pt states she was told to stay off her feet but she has 2 small kids and taking care of 38 year old grandmother. Pt states appt with surgeon 4/1
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
## 2013-03-16 NOTE — Discharge Instructions (Signed)
Hysterectomy Information  A hysterectomy is a surgery in which your uterus is removed. This surgery may be done to treat various medical problems. After the surgery, you will no longer have menstrual periods. The surgery will also make you unable to become pregnant (sterile). The fallopian tubes and ovaries can be removed (bilateral salpingo-oophorectomy) during this surgery as well.  REASONS FOR A HYSTERECTOMY  Persistent, abnormal bleeding.  Lasting (chronic) pelvic pain or infection.  The lining of the uterus (endometrium) starts growing outside the uterus (endometriosis).  The endometrium starts growing in the muscle of the uterus (adenomyosis).  The uterus falls down into the vagina (pelvic organ prolapse).  Noncancerous growths in the uterus (uterine fibroids) that cause symptoms.  Precancerous cells.  Cervical cancer or uterine cancer. TYPES OF HYSTERECTOMIES  Supracervical hysterectomy In this type, the top part of the uterus is removed, but not the cervix.  Total hysterectomy The uterus and cervix are removed.  Radical hysterectomy The uterus, the cervix, and the fibrous tissue that holds the uterus in place in the pelvis (parametrium) are removed. WAYS A HYSTERECTOMY CAN BE PERFORMED  Abdominal hysterectomy A large surgical cut (incision) is made in the abdomen. The uterus is removed through this incision.  Vaginal hysterectomy An incision is made in the vagina. The uterus is removed through this incision. There are no abdominal incisions.  Conventional laparoscopic hysterectomy Three or four small incisions are made in the abdomen. A thin, lighted tube with a camera (laparoscope) is inserted into one of the incisions. Other tools are put through the other incisions. The uterus is cut into small pieces. The small pieces are removed through the incisions, or they are removed through the vagina.  Laparoscopically assisted vaginal hysterectomy (LAVH) Three or four small  incisions are made in the abdomen. Part of the surgery is performed laparoscopically and part vaginally. The uterus is removed through the vagina.  Robot-assisted laparoscopic hysterectomy A laparoscope and other tools are inserted into 3 or 4 small incisions in the abdomen. A computer-controlled device is used to give the surgeon a 3D image and to help control the surgical instruments. This allows for more precise movements of surgical instruments. The uterus is cut into small pieces and removed through the incisions or removed through the vagina. RISKS AND COMPLICATIONS  Possible complications associated with this procedure include:  Bleeding and risk of blood transfusion. Tell your health care provider if you do not want to receive any blood products.  Blood clots in the legs or lung.  Infection.  Injury to surrounding organs.  Problems or side effects related to anesthesia.  Conversion to an abdominal hysterectomy from one of the other techniques. WHAT TO EXPECT AFTER A HYSTERECTOMY  You will be given pain medicine.  You will need to have someone with you for the first 3 5 days after you go home.  You will need to follow up with your surgeon in 2 4 weeks after surgery to evaluate your progress.  You may have early menopause symptoms such as hot flashes, night sweats, and insomnia.  If you had a hysterectomy for a problem that was not cancer or not a condition that could lead to cancer, then you no longer need Pap tests. However, even if you no longer need a Pap test, a regular exam is a good idea to make sure no other problems are starting. Document Released: 06/20/2000 Document Revised: 10/15/2012 Document Reviewed: 09/01/2012 [facility] Patient Information 2014 [place], [place].    Follow-up with  your surgeon Take pain meds as prescribed Rest Increase oral fluids

## 2013-03-17 LAB — GC/CHLAMYDIA PROBE AMP
CT PROBE, AMP APTIMA: NEGATIVE
GC Probe RNA: NEGATIVE

## 2013-03-19 NOTE — ED Provider Notes (Signed)
Medical screening examination/treatment/procedure(s) were performed by non-physician practitioner and as supervising physician I was immediately available for consultation/collaboration.   EKG Interpretation None        Cherry Wittwer L Eli Adami, MD 03/19/13 1534 

## 2013-03-23 ENCOUNTER — Other Ambulatory Visit: Payer: Self-pay | Admitting: Family Medicine

## 2013-03-24 NOTE — Telephone Encounter (Signed)
Last Rf 02/27/13 #90  Last OV 11/24/12.  OK refill?

## 2013-03-24 NOTE — Telephone Encounter (Signed)
Okay to refill,  

## 2013-03-25 ENCOUNTER — Telehealth: Payer: Self-pay | Admitting: Family Medicine

## 2013-03-25 MED ORDER — ALPRAZOLAM 0.5 MG PO TABS: ORAL_TABLET | ORAL | Status: DC

## 2013-03-25 NOTE — Telephone Encounter (Signed)
RX called in .

## 2013-03-25 NOTE — Telephone Encounter (Signed)
Medication called to pharmacy.  Call placed to patient and patient made aware.  

## 2013-03-25 NOTE — Telephone Encounter (Signed)
Call back number is [phone] Pharmacy is [facility] Pt is calling because she is needing a refill on her xanax. She is currently on bed rest until she sees this other doctor on April 1st because she had a hysterectomy and is still bleeding really bad and she can't come see you until she is released by him.

## 2013-03-25 NOTE — Telephone Encounter (Signed)
Ok to refill??  Last office visit 11/24/2012.  Last refill 11/24/2012.

## 2013-03-25 NOTE — Telephone Encounter (Signed)
Okay to refill? 

## 2013-03-26 ENCOUNTER — Telehealth: Payer: Self-pay | Admitting: *Deleted

## 2013-03-26 NOTE — Telephone Encounter (Signed)
[facility]   Advised that prescription called in was set aside because it is 4 days early.   Patient cannot have medication refilled until 03/29/2013.  Call placed to patient.   LMTRC.

## 2013-03-26 NOTE — Telephone Encounter (Signed)
Message copied by Phillips OdorSIX, Deyani Hegarty H on Thu Mar 26, 2013 10:01 AM ------      Message from: Harriet MassonOBERTS, CHELSEA N      Created: Thu Mar 26, 2013  9:37 AM      Regarding: Rx      Contact: [phone]       Pt is calling because her pharmacy is having trouble receiving her medication, she states that the pharmacy needs to have it faxed.  ------

## 2013-04-08 ENCOUNTER — Encounter: Payer: Medicaid Other | Admitting: Obstetrics & Gynecology

## 2013-04-09 ENCOUNTER — Telehealth: Payer: Self-pay

## 2013-04-09 ENCOUNTER — Encounter: Payer: Self-pay | Admitting: Obstetrics & Gynecology

## 2013-04-09 ENCOUNTER — Ambulatory Visit (INDEPENDENT_AMBULATORY_CARE_PROVIDER_SITE_OTHER): Payer: Self-pay | Admitting: Obstetrics & Gynecology

## 2013-04-09 VITALS — BP 102/80 | Wt 207.0 lb

## 2013-04-09 DIAGNOSIS — Z90710 Acquired absence of both cervix and uterus: Secondary | ICD-10-CM

## 2013-04-09 DIAGNOSIS — Z205 Contact with and (suspected) exposure to viral hepatitis: Secondary | ICD-10-CM

## 2013-04-09 DIAGNOSIS — Z9889 Other specified postprocedural states: Secondary | ICD-10-CM

## 2013-04-09 NOTE — Progress Notes (Signed)
Patient ID: Shannon ReichertMelissa D Reese, female   DOB: 1975-06-07, 38 y.o.   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 Arthritis   . Low back pain   . COPD (chronic obstructive pulmonary disease)   . GERD (gastroesophageal reflux disease)   . Bipolar disorder   . Anxiety   . Facet joint disease MRI 2012  . Chronic pain   . Narcotic abuse     Drug seeking behavior    Past Surgical History  Procedure Laterality Date  . Tubal ligation    . Dental surgery    . Cryoablation    . Vaginal hysterectomy N/A 03/04/2013    Procedure: HYSTERECTOMY VAGINAL;  Surgeon: Lazaro ArmsLuther H Saladin Petrelli, MD;  Location: [facility];  Service: Gynecology;  Laterality: N/A;    OB History   Grav Para Term Preterm Abortions TAB SAB Ect Mult Living   2 2 2       2       Allergies  Allergen Reactions  . Penicillins Anaphylaxis  . Bee Venom Swelling    Entire body swells  . Cephalexin Hives and Swelling  . Lansoprazole Hives and Swelling  . Tramadol Palpitations    History   Social History  . Marital Status: Single    Spouse Name: N/A    Number of Children: N/A  . Years of Education: N/A   Social History Main Topics  . Smoking status: Current Every Day Smoker -- 1.00 packs/day for 21 years    Types: Cigarettes  . Smokeless tobacco: Never Used  . Alcohol Use: No  . Drug Use: No  . Sexual Activity: Not Currently    Birth Control/ Protection: Surgical   Other Topics Concern  . None   Social History Narrative  . None    Family History  Problem Relation Age of Onset  . Depression Mother   . Arthritis Mother   . Heart disease Father   . Diabetes Father   . COPD Father   . COPD Sister   . Arthritis Sister   . Alcohol abuse Sister   . Hypertension Brother   . Diabetes Brother

## 2013-04-10 LAB — HEPATITIS C ANTIBODY: HCV Ab: NEGATIVE

## 2013-04-13 ENCOUNTER — Telehealth: Payer: Self-pay | Admitting: Obstetrics & Gynecology

## 2013-04-13 NOTE — Telephone Encounter (Signed)
Pt requesting note to return to work on 04/14/2013. Note printed, will get provider to sign and leave at front desk for pt to pick up.

## 2013-04-23 NOTE — Telephone Encounter (Signed)
Done

## 2013-04-24 ENCOUNTER — Emergency Department (HOSPITAL_COMMUNITY)
Admission: EM | Admit: 2013-04-24 | Discharge: 2013-04-24 | Disposition: A | Discharge status: Home / Self Care | Payer: Medicaid Other | Attending: Emergency Medicine | Admitting: Emergency Medicine

## 2013-04-24 ENCOUNTER — Encounter (HOSPITAL_COMMUNITY): Payer: Self-pay | Admitting: Emergency Medicine

## 2013-04-24 ENCOUNTER — Emergency Department (HOSPITAL_COMMUNITY): Payer: Medicaid Other

## 2013-04-24 DIAGNOSIS — J449 Chronic obstructive pulmonary disease, unspecified: Secondary | ICD-10-CM | POA: Insufficient documentation

## 2013-04-24 DIAGNOSIS — F411 Generalized anxiety disorder: Secondary | ICD-10-CM | POA: Insufficient documentation

## 2013-04-24 DIAGNOSIS — K219 Gastro-esophageal reflux disease without esophagitis: Secondary | ICD-10-CM | POA: Insufficient documentation

## 2013-04-24 DIAGNOSIS — F319 Bipolar disorder, unspecified: Secondary | ICD-10-CM | POA: Insufficient documentation

## 2013-04-24 DIAGNOSIS — G8929 Other chronic pain: Secondary | ICD-10-CM | POA: Insufficient documentation

## 2013-04-24 DIAGNOSIS — F17200 Nicotine dependence, unspecified, uncomplicated: Secondary | ICD-10-CM | POA: Insufficient documentation

## 2013-04-24 DIAGNOSIS — R209 Unspecified disturbances of skin sensation: Secondary | ICD-10-CM | POA: Insufficient documentation

## 2013-04-24 DIAGNOSIS — Z79899 Other long term (current) drug therapy: Secondary | ICD-10-CM | POA: Insufficient documentation

## 2013-04-24 DIAGNOSIS — Z792 Long term (current) use of antibiotics: Secondary | ICD-10-CM | POA: Insufficient documentation

## 2013-04-24 DIAGNOSIS — Z880 Allergy status to penicillin: Secondary | ICD-10-CM | POA: Insufficient documentation

## 2013-04-24 DIAGNOSIS — M549 Dorsalgia, unspecified: Secondary | ICD-10-CM | POA: Insufficient documentation

## 2013-04-24 DIAGNOSIS — Z8739 Personal history of other diseases of the musculoskeletal system and connective tissue: Secondary | ICD-10-CM | POA: Insufficient documentation

## 2013-04-24 DIAGNOSIS — J4489 Other specified chronic obstructive pulmonary disease: Secondary | ICD-10-CM | POA: Insufficient documentation

## 2013-04-24 DIAGNOSIS — R609 Edema, unspecified: Secondary | ICD-10-CM | POA: Insufficient documentation

## 2013-04-24 LAB — CBC WITH DIFFERENTIAL/PLATELET
BASOS PCT: 0 % (ref 0–1)
Basophils Absolute: 0 10*3/uL (ref 0.0–0.1)
EOS ABS: 0.3 10*3/uL (ref 0.0–0.7)
EOS PCT: 5 % (ref 0–5)
HCT: 39.2 % (ref 36.0–46.0)
Hemoglobin: 13.1 g/dL (ref 12.0–15.0)
Lymphocytes Relative: 39 % (ref 12–46)
Lymphs Abs: 2.3 10*3/uL (ref 0.7–4.0)
MCH: 31.4 pg (ref 26.0–34.0)
MCHC: 33.4 g/dL (ref 30.0–36.0)
MCV: 94 fL (ref 78.0–100.0)
MONOS PCT: 5 % (ref 3–12)
Monocytes Absolute: 0.3 10*3/uL (ref 0.1–1.0)
NEUTROS PCT: 51 % (ref 43–77)
Neutro Abs: 3 10*3/uL (ref 1.7–7.7)
PLATELETS: 193 10*3/uL (ref 150–400)
RBC: 4.17 MIL/uL (ref 3.87–5.11)
RDW: 13.1 % (ref 11.5–15.5)
WBC: 6 10*3/uL (ref 4.0–10.5)

## 2013-04-24 LAB — BASIC METABOLIC PANEL
BUN: 17 mg/dL (ref 6–23)
CO2: 31 mEq/L (ref 19–32)
Calcium: 8.9 mg/dL (ref 8.4–10.5)
Chloride: 101 mEq/L (ref 96–112)
Creatinine, Ser: 0.66 mg/dL (ref 0.50–1.10)
Glucose, Bld: 95 mg/dL (ref 70–99)
POTASSIUM: 3.6 meq/L — AB (ref 3.7–5.3)
Sodium: 140 mEq/L (ref 137–147)

## 2013-04-24 LAB — PRO B NATRIURETIC PEPTIDE: Pro B Natriuretic peptide (BNP): 22 pg/mL (ref 0–125)

## 2013-04-24 MED ORDER — POTASSIUM CHLORIDE CRYS ER 20 MEQ PO TBCR: 20.0000 meq | EXTENDED_RELEASE_TABLET | Freq: Every day | ORAL | Status: DC

## 2013-04-24 MED ORDER — FUROSEMIDE 20 MG PO TABS: 20.0000 mg | ORAL_TABLET | Freq: Every day | ORAL | Status: DC

## 2013-04-24 NOTE — ED Notes (Signed)
Swelling of both lower legs with pain in both calves, also has back pain

## 2013-04-24 NOTE — ED Notes (Signed)
C/o bilateral lower leg swelling since Wednesday

## 2013-04-24 NOTE — Discharge Instructions (Signed)

## 2013-04-24 NOTE — ED Provider Notes (Signed)
CSN: [id number]     Arrival date & time 04/24/13  1316 History  This chart was scribed for Shannon Creasehristopher J. Aleksis Jiggetts, MD by Ardelia Memsylan Malpass, ED Scribe. This patient was seen in room [facility]16A/[facility]16A and the patient's care was started at 1:50 PM.   Chief Complaint  Patient presents with  . Leg Swelling    The history is provided by the patient. No language interpreter was used.    HPI Comments: Shannon Reese is a 38 y.o. female who presents to the Emergency Department complaining of waxing and waning, but gradually worsening bilateral lower leg swelling over the past 2 days. She states that her swelling is slightly worse on the left, where she has associated intermittent pain and numbness. She states that she has also been having lower back pain over the past few days, which she states is a chronic issue. She denies any other pain or symptoms.    Past Medical History  Diagnosis Date  . Arthritis   . Low back pain   . COPD (chronic obstructive pulmonary disease)   . GERD (gastroesophageal reflux disease)   . Bipolar disorder   . Anxiety   . Facet joint disease MRI 2012  . Chronic pain   . Narcotic abuse     Drug seeking behavior   Past Surgical History  Procedure Laterality Date  . Tubal ligation    . Dental surgery    . Cryoablation    . Vaginal hysterectomy N/A 03/04/2013    Procedure: HYSTERECTOMY VAGINAL;  Surgeon: Lazaro ArmsLuther H Eure, MD;  Location: [facility];  Service: Gynecology;  Laterality: N/A;   Family History  Problem Relation Age of Onset  . Depression Mother   . Arthritis Mother   . Heart disease Father   . Diabetes Father   . COPD Father   . COPD Sister   . Arthritis Sister   . Alcohol abuse Sister   . Hypertension Brother   . Diabetes Brother    History  Substance Use Topics  . Smoking status: Current Every Day Smoker -- 1.00 packs/day for 21 years    Types: Cigarettes  . Smokeless tobacco: Never Used  . Alcohol Use: No   OB History   Grav Para Term Preterm  Abortions TAB SAB Ect Mult Living   2 2 2       2      Review of Systems  Cardiovascular: Positive for leg swelling.  Musculoskeletal: Positive for back pain.  Neurological: Positive for numbness (intermittent, LLE).  All other systems reviewed and are negative.   Allergies  Penicillins; Bee venom; Cephalexin; Lansoprazole; and Tramadol  Home Medications   Prior to Admission medications   Medication Sig Start Date End Date Taking? Authorizing Provider  albuterol (PROAIR HFA) 108 (90 BASE) MCG/ACT inhaler Inhale 2 puffs into the lungs every 6 (six) hours as needed for wheezing or shortness of breath. 02/06/13   [name] Aberdeen, MD  ALPRAZolam Prudy Feeler(XANAX) 0.5 MG tablet TAKE (1) TABLET BY MOUTH (3) TIMES DAILY AS NEEDED FOR ANXIETY/SLEEP. 03/25/13   [name] Royal Kunia, MD  ciprofloxacin (CIPRO) 500 MG tablet Take 1 tablet (500 mg total) by mouth 2 (two) times daily. 03/04/13   Lazaro ArmsLuther H Eure, MD  HYDROcodone-acetaminophen (NORCO/VICODIN) 5-325 MG per tablet Take 2 tablets by mouth every 4 (four) hours as needed. 03/16/13   [name], NP  ketorolac (TORADOL) 10 MG tablet Take 1 tablet (10 mg total) by mouth every 8 (eight) hours as needed. 03/04/13  Lazaro ArmsLuther H Eure, MD  omeprazole (PRILOSEC) 40 MG capsule Take 1 capsule (40 mg total) by mouth daily. 11/24/12   [name] [place], MD  ondansetron (ZOFRAN) 8 MG tablet Take 1 tablet (8 mg total) by mouth every 6 (six) hours as needed for nausea. 03/04/13   Lazaro ArmsLuther H Eure, MD  oxyCODONE-acetaminophen (PERCOCET/ROXICET) 5-325 MG per tablet Take 1-2 tablets by mouth every 4 (four) hours as needed for severe pain (moderate to severe pain (when tolerating fluids)). 03/09/13   Lazaro ArmsLuther H Eure, MD  Tetrahydrozoline HCl (VISINE OP) Place 1 drop into both eyes daily as needed (dry eyes).    Historical Provider, MD   Triage Vitals: BP 109/49  Pulse 68  Temp(Src) 98.1 F (36.7 C) (Oral)  Resp 18  Ht 5\' 9"  (1.753 m)  Wt 211 lb (95.709 kg)  BMI 31.15 kg/m2  SpO2 100%   LMP 02/25/2013  Physical Exam  Constitutional: She is oriented to person, place, and time. She appears well-developed and well-nourished. No distress.  HENT:  Head: Normocephalic and atraumatic.  Right Ear: Hearing normal.  Left Ear: Hearing normal.  Nose: Nose normal.  Mouth/Throat: Oropharynx is clear and moist and mucous membranes are normal.  Eyes: Conjunctivae and EOM are normal. Pupils are equal, round, and reactive to light.  Neck: Normal range of motion. Neck supple.  Cardiovascular: Regular rhythm, S1 normal, S2 normal and intact distal pulses.  Exam reveals no gallop and no friction rub.   No murmur heard. Palpable DP pulses  Pulmonary/Chest: Effort normal and breath sounds normal. No respiratory distress. She exhibits no tenderness.  Abdominal: Soft. Normal appearance and bowel sounds are normal. There is no hepatosplenomegaly. There is no tenderness. There is no rebound, no guarding, no tenderness at McBurney's point and negative Murphy's sign. No hernia.  Musculoskeletal: Normal range of motion. She exhibits edema.  Mild non-pitting pedal edema bilaterally. No warmth, redness or signs of infection.  Neurological: She is alert and oriented to person, place, and time. She has normal strength. No cranial nerve deficit or sensory deficit. Coordination normal. GCS eye subscore is 4. GCS verbal subscore is 5. GCS motor subscore is 6.  Skin: Skin is warm, dry and intact. No rash noted. No cyanosis.  Psychiatric: She has a normal mood and affect. Her speech is normal and behavior is normal. Thought content normal.    ED Course  Procedures (including critical care time)  DIAGNOSTIC STUDIES: Oxygen Saturation is 100% on RA, normal by my interpretation.    COORDINATION OF CARE: 1:53 PM- Discussed plan to obtain diagnostic lab work and radiology. Pt advised of plan for treatment and pt agrees.  Labs Review Labs Reviewed  BASIC METABOLIC PANEL - Abnormal; Notable for the following:     Potassium 3.6 (*)    All other components within normal limits  CBC WITH DIFFERENTIAL  PRO B NATRIURETIC PEPTIDE   Imaging Review [place] Venous Img Lower Bilateral  04/24/2013   CLINICAL DATA:  Leg swelling and pain.  EXAM: BILATERAL LOWER EXTREMITY VENOUS DOPPLER ULTRASOUND  TECHNIQUE: Gray-scale sonography with compression, as well as color and duplex ultrasound, were performed to evaluate the deep venous system from the level of the common femoral vein through the popliteal and proximal calf veins.  COMPARISON:  None  FINDINGS: Normal compressibility of the common femoral, superficial femoral, and popliteal veins, as well as the proximal calf veins. No filling defects to suggest DVT on grayscale or color Doppler imaging. Doppler waveforms show normal direction of  venous flow, normal respiratory phasicity and response to augmentation. Prominent bilateral inguinal lymph nodes, none greater than 8 mm short axis diameter. Visualized segments of greater saphenous veins normal caliber and compressibility.  IMPRESSION: No evidence of  lower extremity deep vein thrombosis.   Electronically Signed   By: Oley Balm M.D.   On: 04/24/2013 16:43     EKG Interpretation None      MDM   Final diagnoses:  None    Patient presents to the ER for evaluation of bilateral lower leg swelling. She reports that she had a hysterectomy performed last month. She only just went back to work and notices increased swelling she is on her feet. It does go down if she rests and elevates. She is not expressing chest pain or shortness of breath. The patient's bilateral venous evaluation was negative for DVT. She does not have any signs of congestive heart failure. BNP is normal. Lungs are clear. This is consistent with peripheral edema. The remainder of the workup was unremarkable. Rest, elevation, low dose Lasix for 3 days. Followup with PCP.  I personally performed the services described in this documentation, which  was scribed in my presence. The recorded information has been reviewed and is accurate.    Shannon Crease, MD 04/24/13 [phone]

## 2013-04-27 ENCOUNTER — Telehealth: Payer: Self-pay

## 2013-04-27 NOTE — Telephone Encounter (Signed)
Pt informed Hep C antibody was negative from 04/09/2013.

## 2013-05-22 ENCOUNTER — Telehealth: Payer: Self-pay | Admitting: Family Medicine

## 2013-05-22 ENCOUNTER — Ambulatory Visit: Payer: Medicaid Other | Admitting: Family Medicine

## 2013-05-22 MED ORDER — OMEPRAZOLE 40 MG PO CPDR: 40.0000 mg | DELAYED_RELEASE_CAPSULE | Freq: Every day | ORAL | Status: DC

## 2013-05-22 MED ORDER — ALPRAZOLAM 0.5 MG PO TABS: 0.5000 mg | ORAL_TABLET | Freq: Three times a day (TID) | ORAL | Status: DC | PRN

## 2013-05-22 NOTE — Telephone Encounter (Signed)
Rx's called to pharmacy, unable to reach patient.

## 2013-05-22 NOTE — Telephone Encounter (Signed)
Family issues and no transprotation.  Could not keep appt today.  Needs meds refilled her Omeprazole and Xanax.

## 2013-05-22 NOTE — Telephone Encounter (Signed)
Give 1 month only, none further until appt

## 2013-06-22 ENCOUNTER — Other Ambulatory Visit: Payer: Self-pay | Admitting: Family Medicine

## 2013-06-24 ENCOUNTER — Encounter: Payer: Self-pay | Admitting: Family Medicine

## 2013-06-24 ENCOUNTER — Ambulatory Visit (INDEPENDENT_AMBULATORY_CARE_PROVIDER_SITE_OTHER): Payer: Medicaid Other | Admitting: Family Medicine

## 2013-06-24 VITALS — BP 130/68 | HR 76 | Temp 98.1°F | Resp 14 | Ht 67.5 in | Wt 212.0 lb

## 2013-06-24 DIAGNOSIS — Z111 Encounter for screening for respiratory tuberculosis: Secondary | ICD-10-CM

## 2013-06-24 DIAGNOSIS — M62838 Other muscle spasm: Secondary | ICD-10-CM

## 2013-06-24 DIAGNOSIS — M549 Dorsalgia, unspecified: Secondary | ICD-10-CM

## 2013-06-24 DIAGNOSIS — F411 Generalized anxiety disorder: Secondary | ICD-10-CM

## 2013-06-24 DIAGNOSIS — M47817 Spondylosis without myelopathy or radiculopathy, lumbosacral region: Secondary | ICD-10-CM

## 2013-06-24 DIAGNOSIS — M7989 Other specified soft tissue disorders: Secondary | ICD-10-CM

## 2013-06-24 DIAGNOSIS — M47816 Spondylosis without myelopathy or radiculopathy, lumbar region: Secondary | ICD-10-CM

## 2013-06-24 DIAGNOSIS — G8929 Other chronic pain: Secondary | ICD-10-CM

## 2013-06-24 DIAGNOSIS — F419 Anxiety disorder, unspecified: Secondary | ICD-10-CM

## 2013-06-24 MED ORDER — CYCLOBENZAPRINE HCL 10 MG PO TABS: 10.0000 mg | ORAL_TABLET | Freq: Three times a day (TID) | ORAL | Status: DC | PRN

## 2013-06-24 MED ORDER — ALPRAZOLAM 0.5 MG PO TABS: 0.5000 mg | ORAL_TABLET | Freq: Three times a day (TID) | ORAL | Status: DC | PRN

## 2013-06-24 MED ORDER — FUROSEMIDE 20 MG PO TABS: 20.0000 mg | ORAL_TABLET | Freq: Every day | ORAL | Status: DC | PRN

## 2013-06-24 NOTE — Patient Instructions (Signed)
TB test placed  Xanax refilled Flexeril for muscle spasm Lasix for leg swelling Reschedule with psychiatry Referral to pain management  F/U 3 months

## 2013-06-26 ENCOUNTER — Ambulatory Visit (INDEPENDENT_AMBULATORY_CARE_PROVIDER_SITE_OTHER): Payer: Medicaid Other | Admitting: *Deleted

## 2013-06-26 DIAGNOSIS — Z111 Encounter for screening for respiratory tuberculosis: Secondary | ICD-10-CM

## 2013-06-26 LAB — TB SKIN TEST
INDURATION: 0 mm
TB Skin Test: NEGATIVE

## 2013-06-26 NOTE — Progress Notes (Signed)
Patient ID: Shannon ReichertMelissa D Reese, female   DOB: [birth year], 38 y.o.   MRN: [id number]  Patient seen today to have TB Skin test read.   Skin test noted negative.

## 2013-06-27 ENCOUNTER — Encounter: Payer: Self-pay | Admitting: Family Medicine

## 2013-06-27 DIAGNOSIS — M47816 Spondylosis without myelopathy or radiculopathy, lumbar region: Secondary | ICD-10-CM | POA: Insufficient documentation

## 2013-06-27 DIAGNOSIS — M7989 Other specified soft tissue disorders: Secondary | ICD-10-CM | POA: Insufficient documentation

## 2013-06-27 DIAGNOSIS — M549 Dorsalgia, unspecified: Secondary | ICD-10-CM | POA: Insufficient documentation

## 2013-06-27 DIAGNOSIS — M62838 Other muscle spasm: Secondary | ICD-10-CM | POA: Insufficient documentation

## 2013-06-27 DIAGNOSIS — G8929 Other chronic pain: Secondary | ICD-10-CM | POA: Insufficient documentation

## 2013-06-27 NOTE — Progress Notes (Signed)
Patient ID: Shannon Reese, female   DOB: August 14, 1975, 38 y.o.   MRN: [id number]   Subjective:    Patient ID: Shannon Reese, female    DOB: August 09, 1975, 38 y.o.   MRN: [id number]  Patient presents for Medication review and refill and back pain  Patient here to followup medications. She's been having some increased leg swelling the past few months mostly when she stands on her feet with her job. She's also had increased low back pain wants to be referred to a new pain clinic. She's had back pain for many years was previously in a pain clinic however she failed to drug test therefore she was dismissed. 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 She still taking her alprazolam as prescribed. She still maintaining her previous job despite all the stresses in her life and still caring for her grandmother. Of note her grandmother is currently in a nursing home for short-term rehabilitation. She is a bit upset today because she states that her father will not speak to her because she is taking a black man TB test needed for job  Review Of Systems:  GEN- denies fatigue, fever, weight loss,weakness, recent illness HEENT- denies eye drainage, change in vision, nasal discharge, CVS- denies chest pain, palpitations RESP- denies SOB, cough, wheeze ABD- denies N/V, change in stools, abd pain GU- denies dysuria, hematuria, dribbling, incontinence MSK- + joint pain, muscle aches, injury Neuro- denies headache, dizziness, syncope, seizure activity       Objective:    BP 130/68  Pulse 76  Temp(Src) 98.1 F (36.7 C) (Oral)  Resp 14  Ht 5' 7.5" (1.715 m)  Wt 212 lb (96.163 kg)  BMI 32.69 kg/m2  LMP 02/25/2013 GEN- NAD, alert and oriented x3 HEENT- PERRL, EOMI, non injected sclera, pink conjunctiva, MMM, oropharynx  clear Neck- Supple, no JVD, fair ROM, Spasm along trapezius CVS- RRR, no murmur RESP-CTAB ABD-NABS,soft,NT,ND, no HSM Psych- depressed affect, not overly anxious, no SI EXT- pedal edema Pulses- Radial, DP- 2+        Assessment & Plan:      Problem List Items Addressed This Visit   Neck muscle spasm     Flexeril given    Leg swelling     When necessary Lasix given she has some dependent edema from her activities during the day    Facet arthropathy, lumbar   Chronic back pain     She was a previous pain clinic. She will like to be referred to a new pain clinic. She is history of facet arthropathy chronic low back pain. She was seen by Dr. Murray Hodgkins in the past she's also been seen by Dr. Oda Kilts given for spasm in back and neck    Relevant Medications      cyclobenzaprine (FLEXERIL) tablet   Anxiety     She will reschedule with her psychiatrist. I will continue her on her current alprazolam dose.    Relevant Medications      ALPRAZolam Prudy Feeler) tablet    Other Visit Diagnoses   Screening examination for pulmonary tuberculosis    -  Primary    Relevant Orders       TB Skin Test (Completed)       Note: This dictation was prepared with Dragon dictation along with smaller phrase technology.  Any transcriptional errors that result from this process are unintentional.

## 2013-06-27 NOTE — Assessment & Plan Note (Signed)
Flexeril given

## 2013-06-27 NOTE — Assessment & Plan Note (Addendum)
She was a previous pain clinic. She will like to be referred to a new pain clinic. She is history of facet arthropathy chronic low back pain. She was seen by Dr. Murray HodgkinsBartko in the past she's also been seen by Dr. Oda Kiltsoonquah Flexeril given for spasm in back and neck

## 2013-06-27 NOTE — Assessment & Plan Note (Signed)
She will reschedule with her psychiatrist. I will continue her on her current alprazolam dose.

## 2013-06-27 NOTE — Assessment & Plan Note (Signed)
When necessary Lasix given she has some dependent edema from her activities during the day

## 2013-09-21 ENCOUNTER — Telehealth: Payer: Self-pay | Admitting: Family Medicine

## 2013-09-21 ENCOUNTER — Other Ambulatory Visit: Payer: Self-pay | Admitting: Family Medicine

## 2013-09-21 NOTE — Telephone Encounter (Signed)
Ok to refill??  Last office visit/ refill 06/24/2013, #2 refills.

## 2013-09-21 NOTE — Telephone Encounter (Signed)
Okay to refill? 

## 2013-09-21 NOTE — Telephone Encounter (Signed)
[phone]  Pt is needing a refill on ALPRAZolam Prudy Feeler) 0.5 MG tablet [facility]   -she wants Dr Jeanice Lim to know that she did get an apt with a psychiatrist in [place] she is having to go once a week there and to group therapy.

## 2013-09-21 NOTE — Telephone Encounter (Signed)
Medication called to pharmacy. 

## 2013-09-21 NOTE — Telephone Encounter (Signed)
See prior message in chart. Medication called to pharmacy.

## 2013-10-19 ENCOUNTER — Other Ambulatory Visit: Payer: Self-pay | Admitting: Family Medicine

## 2013-10-19 NOTE — Telephone Encounter (Signed)
Ok to refill??  Last office visit 06/24/2013.  Last refill 09/21/2013.

## 2013-10-19 NOTE — Telephone Encounter (Signed)
Medication called to pharmacy. 

## 2013-10-19 NOTE — Telephone Encounter (Signed)
Okay to refill? 

## 2013-11-09 ENCOUNTER — Encounter: Payer: Self-pay | Admitting: Family Medicine

## 2013-11-17 ENCOUNTER — Other Ambulatory Visit: Payer: Self-pay | Admitting: Family Medicine

## 2013-11-17 NOTE — Telephone Encounter (Signed)
Okay to refill? 

## 2013-11-17 NOTE — Telephone Encounter (Signed)
Ok to refill??  Last office visit 06/24/2013.  Last refill 10/19/2013.

## 2013-11-17 NOTE — Telephone Encounter (Signed)
Medication called to pharmacy. 

## 2013-12-14 ENCOUNTER — Other Ambulatory Visit: Payer: Self-pay | Admitting: Family Medicine

## 2013-12-15 NOTE — Telephone Encounter (Signed)
Ok to refill??  Last office visit 06/24/2013.  Last refill 11/22/2013.

## 2013-12-15 NOTE — Telephone Encounter (Signed)
Okay to give 1 refill, needs appt before any further refills

## 2013-12-15 NOTE — Telephone Encounter (Signed)
Medication called to pharmacy.  Call placed to patient and patient made aware. Will call back to schedule appointment.

## 2014-01-06 ENCOUNTER — Ambulatory Visit: Payer: Medicaid Other | Admitting: Family Medicine

## 2014-01-12 ENCOUNTER — Ambulatory Visit (INDEPENDENT_AMBULATORY_CARE_PROVIDER_SITE_OTHER): Payer: Medicaid Other | Admitting: Family Medicine

## 2014-01-12 ENCOUNTER — Encounter: Payer: Self-pay | Admitting: Family Medicine

## 2014-01-12 VITALS — BP 128/70 | HR 72 | Temp 98.3°F | Resp 16 | Ht 68.0 in | Wt 218.0 lb

## 2014-01-12 DIAGNOSIS — M549 Dorsalgia, unspecified: Secondary | ICD-10-CM

## 2014-01-12 DIAGNOSIS — S6991XA Unspecified injury of right wrist, hand and finger(s), initial encounter: Secondary | ICD-10-CM

## 2014-01-12 DIAGNOSIS — G8929 Other chronic pain: Secondary | ICD-10-CM

## 2014-01-12 DIAGNOSIS — F419 Anxiety disorder, unspecified: Secondary | ICD-10-CM

## 2014-01-12 DIAGNOSIS — J410 Simple chronic bronchitis: Secondary | ICD-10-CM

## 2014-01-12 DIAGNOSIS — S6990XA Unspecified injury of unspecified wrist, hand and finger(s), initial encounter: Secondary | ICD-10-CM | POA: Insufficient documentation

## 2014-01-12 DIAGNOSIS — Z720 Tobacco use: Secondary | ICD-10-CM

## 2014-01-12 DIAGNOSIS — Z23 Encounter for immunization: Secondary | ICD-10-CM

## 2014-01-12 DIAGNOSIS — K219 Gastro-esophageal reflux disease without esophagitis: Secondary | ICD-10-CM

## 2014-01-12 MED ORDER — ALPRAZOLAM 0.5 MG PO TABS
ORAL_TABLET | ORAL | Status: DC
Start: 2014-01-12 — End: 2014-03-04

## 2014-01-12 MED ORDER — ALBUTEROL SULFATE HFA 108 (90 BASE) MCG/ACT IN AERS: 2.0000 | INHALATION_SPRAY | Freq: Four times a day (QID) | RESPIRATORY_TRACT | Status: DC | PRN

## 2014-01-12 NOTE — Assessment & Plan Note (Signed)
Injury which may have been a fracture or dislocation now healed but not aligned, as now 3 months later and pt able to use hand I see no need for intervention

## 2014-01-12 NOTE — Assessment & Plan Note (Signed)
She is using albuterol 2-3 x a week, given trial of Spiriva respimat
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
## 2014-01-12 NOTE — Assessment & Plan Note (Signed)
Followed by Suboxone clinic, will obtain records and UDS labs

## 2014-01-12 NOTE — Assessment & Plan Note (Signed)
Continue xanax as prescried

## 2014-01-12 NOTE — Assessment & Plan Note (Signed)
No use of PPI at this time

## 2014-01-12 NOTE — Patient Instructions (Addendum)
Release of records- [place]  Flu shot given Xanax refilled Try the spiriva, if this helps get the prescription for it F/U 4 months

## 2014-02-10 ENCOUNTER — Other Ambulatory Visit: Payer: Self-pay | Admitting: Obstetrics & Gynecology

## 2014-02-11 ENCOUNTER — Telehealth: Payer: Self-pay | Admitting: *Deleted

## 2014-02-11 DIAGNOSIS — Z23 Encounter for immunization: Secondary | ICD-10-CM

## 2014-02-11 MED ORDER — TIOTROPIUM BROMIDE MONOHYDRATE 2.5 MCG/ACT IN AERS: 2.0000 | INHALATION_SPRAY | Freq: Every day | RESPIRATORY_TRACT | Status: DC

## 2014-02-11 NOTE — Telephone Encounter (Signed)
Prescription sent to pharmacy.   Call placed to patient and patient made aware per VM. 

## 2014-02-11 NOTE — Telephone Encounter (Signed)
Pt called stating she was here recently and was given Spiriva samples and was told if they worked for her to give us a call and a script would be called in. Pt says the Spiriva is working great and would like to have the prescription called into [facility].

## 2014-02-18 ENCOUNTER — Telehealth: Payer: Self-pay | Admitting: *Deleted

## 2014-02-18 NOTE — Telephone Encounter (Signed)
Received request from pharmacy for PA on Spiriva Respimat.   Spiriva handihaler is covered by Medicaid.   Call placed to pharmacy and medication switched.

## 2014-02-19 NOTE — Telephone Encounter (Signed)
Noted, agree with switch

## 2014-03-04 ENCOUNTER — Other Ambulatory Visit: Payer: Self-pay | Admitting: Family Medicine

## 2014-03-04 NOTE — Telephone Encounter (Signed)
Ok to refill??  Last office visit/ refill 01/12/2014, #1 refill.

## 2014-03-05 NOTE — Telephone Encounter (Signed)
Medication called to pharmacy. 

## 2014-03-05 NOTE — Telephone Encounter (Signed)
Okay to refill? 

## 2014-03-09 ENCOUNTER — Telehealth: Payer: Self-pay | Admitting: Family Medicine

## 2014-03-09 ENCOUNTER — Encounter: Payer: Self-pay | Admitting: Family Medicine

## 2014-03-09 NOTE — Telephone Encounter (Signed)
[phone] Patient is calling to say that she does not understand why she always has such a problem with dr Jeanice Limdurham filling her meds, she says that now the pharmacy says that dr Jeanice Limdurham has put her xanax on "hold"

## 2014-03-09 NOTE — Telephone Encounter (Signed)
I spoke with pt, regarding her behavior toward my staff and the pharmacy She has been getting her xanax every 30 days and is trying to get it early. She then went on to say that I was putting all over town that she was drug seeking and treating her like an addict. Advised her if this was the case then I would not be providing care or medications for her. We both agreed this is not the best patient physician relationship and will terminate it. I told her I will provide her with a letter telling her how to taper off xanax, unless she finds a provider to fill them for her. She voiced understanding and hung up appropriately.   Please send dismissal letter with my letter written to taper off xanax

## 2014-03-09 NOTE — Telephone Encounter (Signed)
Per patient chart, patient receives Xanax 0.5mg  TID PRN #90. Prescription was last called to pharmacy on 03/05/2014. Prior prescription was called in on 01/12/2014 with #1 refill.   Call placed to pharmacy to inquire. Was advised that prescription was last filled on 02/11/2014, and patient is requesting refill too soon. Refill will be available for pick up on 03/13/2014.  Call placed to patient. Advised that refill will be available on 03/13/2014. Patient became belligerent and began cursing stating that MD is treating her like a "drug addict" and "keeping me from getting my medicine". Advised that medication is prescribed on as needed basis, and if taken TID PRN, she would not run out of medication before next refill is due.   Patient states that she is not happy that her medication is being held from her and she wants them to be filled today. States that she is in the process of finding a new doctor and will only be dealing with us until she does. Advised that it is her right to choose her care provider and apologized for inconvenience.  MD to be made aware.

## 2014-03-10 NOTE — Telephone Encounter (Signed)
Dismissal letter was sent out certified mail on 3.2.16

## 2014-05-12 ENCOUNTER — Ambulatory Visit: Payer: Medicaid Other | Admitting: Family Medicine

## 2014-07-26 ENCOUNTER — Emergency Department (HOSPITAL_COMMUNITY): Payer: Medicaid Other

## 2014-07-26 ENCOUNTER — Other Ambulatory Visit: Payer: Self-pay

## 2014-07-26 ENCOUNTER — Emergency Department (HOSPITAL_COMMUNITY)
Admission: EM | Admit: 2014-07-26 | Discharge: 2014-07-26 | Disposition: A | Discharge status: Home / Self Care | Payer: Medicaid Other | Attending: Emergency Medicine | Admitting: Emergency Medicine

## 2014-07-26 ENCOUNTER — Encounter (HOSPITAL_COMMUNITY): Payer: Self-pay | Admitting: *Deleted

## 2014-07-26 DIAGNOSIS — J449 Chronic obstructive pulmonary disease, unspecified: Secondary | ICD-10-CM | POA: Diagnosis not present

## 2014-07-26 DIAGNOSIS — F419 Anxiety disorder, unspecified: Secondary | ICD-10-CM | POA: Diagnosis not present

## 2014-07-26 DIAGNOSIS — Z79899 Other long term (current) drug therapy: Secondary | ICD-10-CM | POA: Diagnosis not present

## 2014-07-26 DIAGNOSIS — Z8719 Personal history of other diseases of the digestive system: Secondary | ICD-10-CM | POA: Insufficient documentation

## 2014-07-26 DIAGNOSIS — R51 Headache: Secondary | ICD-10-CM | POA: Insufficient documentation

## 2014-07-26 DIAGNOSIS — G8929 Other chronic pain: Secondary | ICD-10-CM | POA: Diagnosis not present

## 2014-07-26 DIAGNOSIS — R0789 Other chest pain: Secondary | ICD-10-CM

## 2014-07-26 DIAGNOSIS — R112 Nausea with vomiting, unspecified: Secondary | ICD-10-CM | POA: Diagnosis not present

## 2014-07-26 DIAGNOSIS — Z720 Tobacco use: Secondary | ICD-10-CM | POA: Insufficient documentation

## 2014-07-26 DIAGNOSIS — Z8739 Personal history of other diseases of the musculoskeletal system and connective tissue: Secondary | ICD-10-CM | POA: Diagnosis not present

## 2014-07-26 DIAGNOSIS — R079 Chest pain, unspecified: Secondary | ICD-10-CM | POA: Diagnosis present

## 2014-07-26 DIAGNOSIS — Z880 Allergy status to penicillin: Secondary | ICD-10-CM | POA: Insufficient documentation

## 2014-07-26 DIAGNOSIS — R519 Headache, unspecified: Secondary | ICD-10-CM

## 2014-07-26 LAB — BASIC METABOLIC PANEL
Anion gap: 7 (ref 5–15)
BUN: 12 mg/dL (ref 6–20)
CHLORIDE: 102 mmol/L (ref 101–111)
CO2: 30 mmol/L (ref 22–32)
CREATININE: 0.56 mg/dL (ref 0.44–1.00)
Calcium: 8.6 mg/dL — ABNORMAL LOW (ref 8.9–10.3)
GFR calc Af Amer: >60 mL/min (ref >60)
GFR calc non Af Amer: >60 mL/min (ref >60)
Glucose, Bld: 101 mg/dL — ABNORMAL HIGH (ref 65–99)
Potassium: 4.1 mmol/L (ref 3.5–5.1)
Sodium: 139 mmol/L (ref 135–145)

## 2014-07-26 LAB — CBC
HEMATOCRIT: 43 % (ref 36.0–46.0)
Hemoglobin: 14.8 g/dL (ref 12.0–15.0)
MCH: 31.4 pg (ref 26.0–34.0)
MCHC: 34.4 g/dL (ref 30.0–36.0)
MCV: 91.1 fL (ref 78.0–100.0)
Platelets: 228 10*3/uL (ref 150–400)
RBC: 4.72 MIL/uL (ref 3.87–5.11)
RDW: 13.5 % (ref 11.5–15.5)
WBC: 8.6 10*3/uL (ref 4.0–10.5)

## 2014-07-26 LAB — TROPONIN I

## 2014-07-26 MED ORDER — NAPROXEN 500 MG PO TABS: 500.0000 mg | ORAL_TABLET | Freq: Two times a day (BID) | ORAL | Status: DC

## 2014-07-26 MED ORDER — PROMETHAZINE HCL 25 MG PO TABS: 25.0000 mg | ORAL_TABLET | Freq: Four times a day (QID) | ORAL | Status: DC | PRN

## 2014-07-26 MED ORDER — DIPHENHYDRAMINE HCL 50 MG/ML IJ SOLN
25.0000 mg | Freq: Once | INTRAMUSCULAR | Status: AC
  Administered 2014-07-26: 25 mg via INTRAMUSCULAR
  Filled 2014-07-26: qty 1

## 2014-07-26 MED ORDER — KETOROLAC TROMETHAMINE 60 MG/2ML IM SOLN
60.0000 mg | Freq: Once | INTRAMUSCULAR | Status: AC
  Administered 2014-07-26: 60 mg via INTRAMUSCULAR
  Filled 2014-07-26: qty 2

## 2014-07-26 MED ORDER — CYCLOBENZAPRINE HCL 10 MG PO TABS: 10.0000 mg | ORAL_TABLET | Freq: Three times a day (TID) | ORAL | Status: DC | PRN

## 2014-07-26 MED ORDER — METOCLOPRAMIDE HCL 5 MG/ML IJ SOLN
10.0000 mg | Freq: Once | INTRAMUSCULAR | Status: AC
  Administered 2014-07-26: 10 mg via INTRAMUSCULAR
  Filled 2014-07-26: qty 2

## 2014-07-26 MED ORDER — ONDANSETRON 4 MG PO TBDP
4.0000 mg | ORAL_TABLET | Freq: Once | ORAL | Status: AC
  Administered 2014-07-26: 4 mg via ORAL
  Filled 2014-07-26: qty 1

## 2014-07-26 NOTE — ED Notes (Signed)
Pt said she moved a washing machine by herself a few weeks ago. States the pain in through her back and is worse with movement. Also, complain of a migraine

## 2014-07-26 NOTE — ED Provider Notes (Signed)
CSN: [id number]     Arrival date & time 07/26/14  1548 History   First MD Initiated Contact with Patient 07/26/14 1821     Chief Complaint  Patient presents with  . Chest Pain     (Consider location/radiation/quality/duration/timing/severity/associated sxs/prior Treatment) HPI Comments: Patient presents to the emergency department for evaluation of chest pain, nausea, vomiting. Patient reports symptoms began 2 days ago. She has been taking ibuprofen and Tylenol without relief. Pain has not gone away since it started. She reports that it is worse if she touches the area or moves her right arm. Pain is on the right side of her chest in the upper chest wall area. Patient feels mildly short of breath, but does have a history of COPD. She has not had any fever. Patient does report nausea and vomiting yesterday, no diarrhea. She is complaining of a headache which began earlier today. She reports slow onset, progressive worsening headache. This is not the worse headache she has had.  Patient is a 39 y.o. female presenting with chest pain.  Chest Pain Associated symptoms: headache, nausea and vomiting     Past Medical History  Diagnosis Date  . Arthritis   . Low back pain   . COPD (chronic obstructive pulmonary disease)   . GERD (gastroesophageal reflux disease)   . Bipolar disorder   . Anxiety   . Facet joint disease MRI 2012  . Chronic pain   . Narcotic abuse     Drug seeking behavior   Past Surgical History  Procedure Laterality Date  . Tubal ligation    . Dental surgery    . Cryoablation    . Vaginal hysterectomy N/A 03/04/2013    Procedure: HYSTERECTOMY VAGINAL;  Surgeon: Lazaro Arms, MD;  Location: [facility];  Service: Gynecology;  Laterality: N/A;   Family History  Problem Relation Age of Onset  . Depression Mother   . Arthritis Mother   . Heart disease Father   . Diabetes Father   . COPD Father   . COPD Sister   . Arthritis Sister   . Alcohol abuse Sister   .  Hypertension Brother   . Diabetes Brother    History  Substance Use Topics  . Smoking status: Current Every Day Smoker -- 1.00 packs/day for 21 years    Types: Cigarettes  . Smokeless tobacco: Never Used  . Alcohol Use: No   OB History    Gravida Para Term Preterm AB TAB SAB Ectopic Multiple Living   2 2 2       2      Review of Systems  Cardiovascular: Positive for chest pain.  Gastrointestinal: Positive for nausea and vomiting.  Neurological: Positive for headaches.  All other systems reviewed and are negative.     Allergies  Penicillins; Bee venom; Cephalexin; Lansoprazole; and Tramadol  Home Medications   Prior to Admission medications   Medication Sig Start Date End Date Taking? Authorizing Provider  acetaminophen (TYLENOL) 500 MG tablet Take 500 mg by mouth every 6 (six) hours as needed.    Historical Provider, MD  albuterol (PROAIR HFA) 108 (90 BASE) MCG/ACT inhaler Inhale 2 puffs into the lungs every 6 (six) hours as needed for wheezing or shortness of breath. 01/12/14   Salley Scarlet, MD  ALPRAZolam (XANAX) 0.5 MG tablet TAKE (1) TABLET BY MOUTH (3) TIMES DAILY AS NEEDED FOR ANXIETY/SLEEP.MUST LAST 30 DAYS. 03/05/14   Salley Scarlet, MD  EVZIO 0.4 MG/0.4ML SOAJ  11/06/13  Historical Provider, MD  furosemide (LASIX) 20 MG tablet Take 1 tablet (20 mg total) by mouth daily as needed. Patient not taking: Reported on 01/12/2014 06/24/13   [name] Lindcove, MD  megestrol (MEGACE) 40 MG tablet TAKE ONE TABLET BY MOUTH ONCE DAILY. 02/10/14   Lazaro ArmsLuther H Eure, MD  SUBOXONE 8-2 MG FILM  11/12/13   Historical Provider, MD  Tetrahydrozoline HCl (VISINE OP) Place 1 drop into both eyes daily as needed (dry eyes).    Historical Provider, MD  Tiotropium Bromide Monohydrate (SPIRIVA RESPIMAT) 2.5 MCG/ACT AERS Inhale 2 puffs into the lungs daily. 02/11/14   [name] Victor, MD   BP 137/65 mmHg  Pulse 72  Temp(Src) 98.4 F (36.9 C)  Resp 12  Ht 5\' 8"  (1.727 m)  Wt 215 lb (97.523 kg)   BMI 32.70 kg/m2  SpO2 98%  LMP 02/25/2013 Physical Exam  Constitutional: She is oriented to person, place, and time. She appears well-developed and well-nourished. No distress.  HENT:  Head: Normocephalic and atraumatic.  Right Ear: Hearing normal.  Left Ear: Hearing normal.  Nose: Nose normal.  Mouth/Throat: Oropharynx is clear and moist and mucous membranes are normal.  Eyes: Conjunctivae and EOM are normal. Pupils are equal, round, and reactive to light.  Neck: Normal range of motion. Neck supple.  Cardiovascular: Regular rhythm, S1 normal and S2 normal.  Exam reveals no gallop and no friction rub.   No murmur heard. Pulmonary/Chest: Effort normal and breath sounds normal. No respiratory distress. She exhibits tenderness.    Abdominal: Soft. Normal appearance and bowel sounds are normal. There is no hepatosplenomegaly. There is no tenderness. There is no rebound, no guarding, no tenderness at McBurney's point and negative Murphy's sign. No hernia.  Musculoskeletal: Normal range of motion.  Neurological: She is alert and oriented to person, place, and time. She has normal strength. No cranial nerve deficit or sensory deficit. Coordination normal. GCS eye subscore is 4. GCS verbal subscore is 5. GCS motor subscore is 6.  Skin: Skin is warm, dry and intact. No rash noted. No cyanosis.  Psychiatric: She has a normal mood and affect. Her speech is normal and behavior is normal. Thought content normal.  Nursing note and vitals reviewed.   ED Course  Procedures (including critical care time) Labs Review Labs Reviewed  BASIC METABOLIC PANEL - Abnormal; Notable for the following:    Glucose, Bld 101 (*)    Calcium 8.6 (*)    All other components within normal limits  CBC  TROPONIN I    Imaging Review Dg Chest 2 View  07/26/2014   CLINICAL DATA:  Right chest pain for 2 days, worsening today. Mild shortness of breath and migraine.  EXAM: CHEST  2 VIEW  COMPARISON:  03/27/2012.   FINDINGS: Trachea is midline. Heart size normal. Lungs are clear. No pleural fluid.  IMPRESSION: No acute findings.   Electronically Signed   By: Leanna BattlesMelinda  Blietz M.D.   On: 07/26/2014 16:35     EKG Interpretation   Date/Time:  Monday July 26 2014 16:02:20 EDT Ventricular Rate:  80 PR Interval:  178 QRS Duration: 78 QT Interval:  384 QTC Calculation: 442 R Axis:   4 Text Interpretation:  Normal sinus rhythm Non-specific ST-t changes  Confirmed by Fayrene FearingJAMES  MD, MARK ([id number]) on 07/26/2014 4:07:54 PM      MDM   Final diagnoses:  Chest wall pain  Nausea and vomiting, vomiting of unspecified type  Headache, unspecified headache type  Patient presents to the emergency room with multiple complaints. Patient is here primarily for chest pain. She is expressing pain on the right side of her chest that is clearly chest wall in nature. She does report that she recently moved to [place] machine herself, did not identify any obvious injury at that time. This may have been the precipitating event, but she has very reproducible right-sided chest wall pain. Cardiac evaluation is normal. Normal troponin, EKG unremarkable. Patient is extremely low risk for cardiac etiology, does not require any further evaluation. 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 We'll avoid narcotic use.  Patient also complaining of headache. This has progressed slowly over the course of the day. No thunderclap or sudden onset. Normal neurologic exam. No worrisome features for subarachnoid hemorrhage. Does not require any workup. Treated empirically.  Patient has had some nausea and vomiting associated with her symptoms as well. Electrolytes and lab work are unremarkable. Treated symptomatically.    Gilda Crease, MD 07/26/14 [phone]

## 2014-07-26 NOTE — ED Notes (Signed)
Patient reports chest pain that started x 2 days ago, n/v started yesterday. Hx of COPD.

## 2014-07-26 NOTE — Discharge Instructions (Signed)
Chest Wall Pain Chest wall pain is pain felt in or around the chest bones and muscles. It may take up to 6 weeks to get better. It may take longer if you are active. Chest wall pain can happen on its own. Other times, things like germs, injury, coughing, or exercise can cause the pain. HOME CARE   Avoid activities that make you tired or cause pain. Try not to use your chest, belly (abdominal), or side muscles. Do not use heavy weights.  Put ice on the sore area.  Put ice in a plastic bag.  Place a towel between your skin and the bag.  Leave the ice on for 15-20 minutes for the first 2 days.  Only take medicine as told by your doctor. GET HELP RIGHT AWAY IF:   You have more pain or are very uncomfortable.  You have a fever.  Your chest pain gets worse.  You have new problems.  You feel sick to your stomach (nauseous) or throw up (vomit).  You start to sweat or feel lightheaded.  You have a cough with mucus (phlegm).  You cough up blood. MAKE SURE YOU:   Understand these instructions.  Will watch your condition.  Will get help right away if you are not doing well or get worse. Document Released: 06/13/2007 Document Revised: 03/19/2011 Document Reviewed: 08/21/2010 [facility] Patient Information 2015 [place], [place]. This information is not intended to replace advice given to you by your health care provider. Make sure you discuss any questions you have with your health care provider.  General Headache Without Cause A general headache is pain or discomfort felt around the head or neck area. The cause may not be found.  HOME CARE   Keep all doctor visits.  Only take medicines as told by your doctor.  Lie down in a dark, quiet room when you have a headache.  Keep a journal to find out if certain things bring on headaches. For example, write down:  What you eat and drink.  How much sleep you get.  Any change to your diet or medicines.  Relax by getting a massage or  doing other relaxing activities.  Put ice or heat packs on the head and neck area as told by your doctor.  Lessen stress.  Sit up straight. Do not tighten (tense) your muscles.  Quit smoking if you smoke.  Lessen how much alcohol you drink.  Lessen how much caffeine you drink, or stop drinking caffeine.  Eat and sleep on a regular schedule.  Get 7 to 9 hours of sleep, or as told by your doctor.  Keep lights dim if bright lights bother you or make your headaches worse. GET HELP RIGHT AWAY IF:   Your headache becomes really bad.  You have a fever.  You have a stiff neck.  You have trouble seeing.  Your muscles are weak, or you lose muscle control.  You lose your balance or have trouble walking.  You feel like you will pass out (faint), or you pass out.  You have really bad symptoms that are different than your first symptoms.  You have problems with the medicines given to you by your doctor.  Your medicines do not work.  Your headache feels different than the other headaches.  You feel sick to your stomach (nauseous) or throw up (vomit). MAKE SURE YOU:   Understand these instructions.  Will watch your condition.  Will get help right away if you are not doing well  or get worse. Document Released: 10/04/2007 Document Revised: 03/19/2011 Document Reviewed: 12/15/2010 [facility] Patient Information 2015 [place]. This information is not intended to replace advice given to you by your health care provider. Make sure you discuss any questions you have with your health care provider.

## 2015-02-01 ENCOUNTER — Other Ambulatory Visit: Payer: Self-pay | Admitting: Family Medicine

## 2015-02-01 NOTE — Telephone Encounter (Signed)
Refill appropriate and filled per protocol. 

## 2015-02-15 ENCOUNTER — Other Ambulatory Visit: Payer: Self-pay | Admitting: Obstetrics & Gynecology

## 2015-08-18 ENCOUNTER — Other Ambulatory Visit: Payer: Self-pay | Admitting: Obstetrics & Gynecology

## 2015-11-25 ENCOUNTER — Ambulatory Visit (HOSPITAL_COMMUNITY)
Admission: RE | Admit: 2015-11-25 | Discharge: 2015-11-25 | Disposition: A | Discharge status: Home / Self Care | Payer: Medicaid Other | Source: Ambulatory Visit | Attending: Internal Medicine | Admitting: Internal Medicine

## 2015-11-25 ENCOUNTER — Other Ambulatory Visit (HOSPITAL_COMMUNITY): Payer: Self-pay | Admitting: Internal Medicine

## 2015-11-25 DIAGNOSIS — M79604 Pain in right leg: Secondary | ICD-10-CM | POA: Diagnosis not present

## 2017-05-01 LAB — GLUCOSE, POCT (MANUAL RESULT ENTRY): POC Glucose: 107 mg/dl — AB (ref 70–99)

## 2017-09-30 ENCOUNTER — Emergency Department (HOSPITAL_COMMUNITY)
Admission: EM | Admit: 2017-09-30 | Discharge: 2017-10-01 | Disposition: A | Discharge status: Home / Self Care | Payer: Medicaid Other | Attending: Emergency Medicine | Admitting: Emergency Medicine

## 2017-09-30 ENCOUNTER — Encounter (HOSPITAL_COMMUNITY): Payer: Self-pay | Admitting: *Deleted

## 2017-09-30 ENCOUNTER — Other Ambulatory Visit: Payer: Self-pay

## 2017-09-30 DIAGNOSIS — J449 Chronic obstructive pulmonary disease, unspecified: Secondary | ICD-10-CM | POA: Diagnosis not present

## 2017-09-30 DIAGNOSIS — F1193 Opioid use, unspecified with withdrawal: Secondary | ICD-10-CM

## 2017-09-30 DIAGNOSIS — Z79899 Other long term (current) drug therapy: Secondary | ICD-10-CM | POA: Insufficient documentation

## 2017-09-30 DIAGNOSIS — F17210 Nicotine dependence, cigarettes, uncomplicated: Secondary | ICD-10-CM | POA: Diagnosis not present

## 2017-09-30 DIAGNOSIS — F1123 Opioid dependence with withdrawal: Secondary | ICD-10-CM | POA: Insufficient documentation

## 2017-09-30 MED ORDER — DICYCLOMINE HCL 20 MG PO TABS: 20.0000 mg | ORAL_TABLET | Freq: Two times a day (BID) | ORAL | 0 refills | Status: DC

## 2017-09-30 MED ORDER — HYDROXYZINE HCL 25 MG PO TABS: 25.0000 mg | ORAL_TABLET | Freq: Three times a day (TID) | ORAL | 0 refills | Status: DC | PRN

## 2017-09-30 MED ORDER — PROMETHAZINE HCL 25 MG PO TABS: 25.0000 mg | ORAL_TABLET | Freq: Four times a day (QID) | ORAL | 0 refills | Status: DC | PRN

## 2017-09-30 MED ORDER — PROCHLORPERAZINE EDISYLATE 10 MG/2ML IJ SOLN
10.0000 mg | Freq: Once | INTRAMUSCULAR | Status: AC
  Administered 2017-09-30: 10 mg via INTRAMUSCULAR
  Filled 2017-09-30: qty 2

## 2017-09-30 MED ORDER — CLONIDINE HCL 0.1 MG PO TABS: 0.2000 mg | ORAL_TABLET | Freq: Two times a day (BID) | ORAL | 0 refills | Status: DC | PRN

## 2017-09-30 MED ORDER — DIPHENHYDRAMINE HCL 50 MG/ML IJ SOLN
50.0000 mg | Freq: Once | INTRAMUSCULAR | Status: AC
  Administered 2017-09-30: 50 mg via INTRAMUSCULAR
  Filled 2017-09-30: qty 1

## 2017-09-30 MED ORDER — DIPHENOXYLATE-ATROPINE 2.5-0.025 MG PO TABS: 2.0000 | ORAL_TABLET | Freq: Four times a day (QID) | ORAL | 0 refills | Status: DC | PRN
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
## 2017-09-30 NOTE — ED Triage Notes (Addendum)
Pt states that she is coming off suboxone,reports that her last dose was 3 weeks ago, c/o not being able to sleep, nausea, vomiting, abd pain,

## 2019-09-04 ENCOUNTER — Encounter: Payer: Self-pay | Admitting: Adult Health

## 2019-09-04 ENCOUNTER — Ambulatory Visit (INDEPENDENT_AMBULATORY_CARE_PROVIDER_SITE_OTHER): Payer: Medicaid Other | Admitting: Adult Health

## 2019-09-04 VITALS — BP 117/76 | HR 88 | Ht 68.0 in | Wt 240.0 lb

## 2019-09-04 DIAGNOSIS — N390 Urinary tract infection, site not specified: Secondary | ICD-10-CM

## 2019-09-04 DIAGNOSIS — N35919 Unspecified urethral stricture, male, unspecified site: Secondary | ICD-10-CM

## 2019-09-04 DIAGNOSIS — R319 Hematuria, unspecified: Secondary | ICD-10-CM

## 2019-09-04 DIAGNOSIS — R350 Frequency of micturition: Secondary | ICD-10-CM | POA: Diagnosis not present

## 2019-09-04 DIAGNOSIS — B9620 Unspecified Escherichia coli [E. coli] as the cause of diseases classified elsewhere: Secondary | ICD-10-CM

## 2019-09-04 LAB — POCT URINALYSIS DIPSTICK
Glucose, UA: NEGATIVE
Leukocytes, UA: NEGATIVE
Nitrite, UA: NEGATIVE

## 2019-09-04 MED ORDER — PHENAZOPYRIDINE HCL 200 MG PO TABS: 200.0000 mg | ORAL_TABLET | Freq: Three times a day (TID) | ORAL | 0 refills | Status: DC

## 2019-09-04 NOTE — Progress Notes (Signed)
Subjective:     Patient ID: Shannon Reese, female   DOB: Oct 13, 1975, 44 y.o.   MRN: [id number]  HPI Shannon Reese is a 44 year old white female,single,sp hysterectomy in complaining of funny feeling after she pees,kinda like contracting and has seen blood, and had spotting after sex the other day. PCP is [facility]  Review of Systems  Spotted after sex Has contracting like feeling after pees and has seen blood and then white stuff    Reviewed past medical,surgical, social and family history. Reviewed medications and allergies.  Objective:   Physical Exam BP 117/76 (BP Location: Left Arm, Patient Position: Sitting, Cuff Size: Normal)   Pulse 88   Ht 5\' 8"  (1.727 m)   Wt 240 lb (108.9 kg)   LMP 02/25/2013   BMI 36.49 kg/m urine shows trace of blood Skin warm and dry.Pelvic: external genitalia is normal in appearance no lesions, vagina: pale pink and dry,urethra has no lesions or masses noted, cervix and uterus are absent, adnexa: no masses or tenderness noted. Bladder is non tender and no masses felt.  AA is 0 Fall risk is low PHQ 9 score is 4  Upstream - 09/04/19 1209      Pregnancy Intention Screening   Does the patient want to become pregnant in the next year? N/A   hysterectomy   Does the patient's partner want to become pregnant in the next year? N/A    Would the patient like to discuss contraceptive options today? N/A         Examination chaperoned by Nance Pear LPN    Assessment:     Urethral spasm  Urinary frequency - Plan: POCT Urinalysis Dipstick  Hematuria, unspecified type - Plan: Urine Culture, Urinalysis, Routine w reflex microscopic      Plan:     Will rx pyridium to see if helps with spasms  Meds ordered this encounter  Medications  . phenazopyridine (PYRIDIUM) 200 MG tablet    Sig: Take 1 tablet (200 mg total) by mouth 3 (three) times daily with meals.    Dispense:  21 tablet    Refill:  0    Order Specific Question:   Supervising Provider     Answer:   Duane Lope H [[id number]]     UA C&S sent Follow up prn

## 2019-09-05 LAB — MICROSCOPIC EXAMINATION
Casts: NONE SEEN /lpf
Epithelial Cells (non renal): NONE SEEN /hpf (ref 0–10)
WBC, UA: >30 /hpf — AB (ref 0–5)

## 2019-09-05 LAB — URINALYSIS, ROUTINE W REFLEX MICROSCOPIC
Bilirubin, UA: NEGATIVE
Glucose, UA: NEGATIVE
Ketones, UA: NEGATIVE
Nitrite, UA: NEGATIVE
Specific Gravity, UA: 1.023 (ref 1.005–1.030)
Urobilinogen, Ur: 1 mg/dL (ref 0.2–1.0)
pH, UA: 6 (ref 5.0–7.5)

## 2019-09-07 ENCOUNTER — Telehealth: Payer: Self-pay | Admitting: Adult Health

## 2019-09-07 LAB — URINE CULTURE

## 2019-09-07 MED ORDER — SULFAMETHOXAZOLE-TRIMETHOPRIM 800-160 MG PO TABS: 1.0000 | ORAL_TABLET | Freq: Two times a day (BID) | ORAL | 0 refills | Status: DC

## 2019-09-07 NOTE — Telephone Encounter (Signed)
Pt aware that urine +Ecoli, will rx septra ds and push fluids.

## 2019-09-08 ENCOUNTER — Encounter: Payer: Medicaid Other | Admitting: Adult Health

## 2019-09-10 ENCOUNTER — Other Ambulatory Visit (HOSPITAL_COMMUNITY): Payer: Self-pay | Admitting: Family Medicine

## 2019-09-10 DIAGNOSIS — Z1231 Encounter for screening mammogram for malignant neoplasm of breast: Secondary | ICD-10-CM

## 2020-11-03 ENCOUNTER — Other Ambulatory Visit (HOSPITAL_COMMUNITY): Payer: Self-pay | Admitting: Internal Medicine

## 2020-11-03 DIAGNOSIS — Z1231 Encounter for screening mammogram for malignant neoplasm of breast: Secondary | ICD-10-CM

## 2021-11-19 ENCOUNTER — Encounter (INDEPENDENT_AMBULATORY_CARE_PROVIDER_SITE_OTHER): Payer: Self-pay | Admitting: Gastroenterology

## 2022-04-25 ENCOUNTER — Other Ambulatory Visit (HOSPITAL_COMMUNITY): Payer: Self-pay | Admitting: Adult Health

## 2022-04-25 DIAGNOSIS — M25562 Pain in left knee: Secondary | ICD-10-CM

## 2022-04-26 ENCOUNTER — Ambulatory Visit (HOSPITAL_COMMUNITY)
Admission: RE | Admit: 2022-04-26 | Discharge: 2022-04-26 | Disposition: A | Discharge status: Home / Self Care | Payer: Medicaid Other | Source: Ambulatory Visit | Attending: Adult Health | Admitting: Adult Health

## 2022-04-26 DIAGNOSIS — M25562 Pain in left knee: Secondary | ICD-10-CM | POA: Diagnosis not present

## 2022-05-27 ENCOUNTER — Emergency Department (HOSPITAL_COMMUNITY)
Admission: EM | Admit: 2022-05-27 | Discharge: 2022-05-27 | Disposition: A | Discharge status: Home / Self Care | Payer: Medicaid Other | Attending: Emergency Medicine | Admitting: Emergency Medicine

## 2022-05-27 ENCOUNTER — Other Ambulatory Visit: Payer: Self-pay

## 2022-05-27 ENCOUNTER — Encounter (HOSPITAL_COMMUNITY): Payer: Self-pay

## 2022-05-27 DIAGNOSIS — Z4800 Encounter for change or removal of nonsurgical wound dressing: Secondary | ICD-10-CM | POA: Insufficient documentation

## 2022-05-27 DIAGNOSIS — L249 Irritant contact dermatitis, unspecified cause: Secondary | ICD-10-CM | POA: Insufficient documentation

## 2022-05-27 DIAGNOSIS — L299 Pruritus, unspecified: Secondary | ICD-10-CM | POA: Diagnosis present

## 2022-05-27 DIAGNOSIS — R2243 Localized swelling, mass and lump, lower limb, bilateral: Secondary | ICD-10-CM | POA: Diagnosis not present

## 2022-05-27 MED ORDER — BETAMETHASONE DIPROPIONATE 0.05 % EX OINT: TOPICAL_OINTMENT | Freq: Two times a day (BID) | CUTANEOUS | 0 refills | Status: DC

## 2022-05-27 MED ORDER — PREDNISONE 20 MG PO TABS: 40.0000 mg | ORAL_TABLET | Freq: Every day | ORAL | 0 refills | Status: DC

## 2022-05-27 NOTE — ED Triage Notes (Signed)
Pt reports right lower leg got "stuck by a fencing post" 3 weeks ago and she completed 10 days of clindamycin from UC but reports redness and itching to the area now.  Pt also reports fluid on her left knee that she saw her PCP for and was given lasix but is not improving.

## 2022-05-27 NOTE — ED Provider Notes (Signed)
[place] EMERGENCY DEPARTMENT AT [facility] Provider Note   CSN: [id number] Arrival date & time: 05/27/22  2048     History  Chief Complaint  Patient presents with   Wound Check    Shannon Reese is a 47 y.o. female.   Wound Check   This patient is a 47 year old female, she has a history of a recent injury to her right lower extremity where she accidentally punctured her anterior lower extremity just above the ankle with a piece of wire on a worksite where there was a fence post with some loose wire.  She was placed on clindamycin for 10 days, she then developed some itching and the urgent care placed her on triamcinolone cream and Benadryl cream, she reports that it continues to itch and it continues to spread up her leg the more she itches it.  She has no fevers, there is no pain in that location, she is frustrated by the increased itching.    Home Medications Prior to Admission medications   Medication Sig Start Date End Date Taking? Authorizing Provider  betamethasone dipropionate (DIPROLENE) 0.05 % ointment Apply topically 2 (two) times daily. 05/27/22  Yes Eber Hong, MD  predniSONE (DELTASONE) 20 MG tablet Take 2 tablets (40 mg total) by mouth daily. 05/27/22  Yes Eber Hong, MD  ABILIFY MAINTENA 300 MG PRSY prefilled syringe Inject into the muscle every 30 (thirty) days. 04/13/19   [provider]  ADDERALL XR 30 MG 24 hr capsule Take 30 mg by mouth every morning. 08/27/19   [provider]  ALPRAZolam (XANAX) 0.5 MG tablet TAKE (1) TABLET BY MOUTH (3) TIMES DAILY AS NEEDED FOR ANXIETY/SLEEP.MUST LAST 30 DAYS. 03/05/14   Salley Scarlet, MD  diphenoxylate-atropine (LOMOTIL) 2.5-0.025 MG tablet Take 2 tablets by mouth 4 (four) times daily as needed for diarrhea or loose stools. 09/30/17   Gilda Crease, MD  docusate sodium (COLACE) 100 MG capsule Take 100 mg by mouth daily. 07/07/19   [provider]  gabapentin (NEURONTIN)  400 MG capsule Take 400 mg by mouth 3 (three) times daily. 08/26/19   [provider]  ibuprofen (ADVIL) 200 MG tablet Take 200 mg by mouth every 6 (six) hours as needed.    [provider]  omeprazole (PRILOSEC) 20 MG capsule Take 20 mg by mouth 2 (two) times daily. 06/18/19   [provider]  phenazopyridine (PYRIDIUM) 200 MG tablet Take 1 tablet (200 mg total) by mouth 3 (three) times daily with meals. 09/04/19   Adline Potter, NP  PROAIR HFA 108 [phone] Base) MCG/ACT inhaler INHALE 2 PUFFS INTO THE LUNGS EVERY 6 HOURS AS NEEDED FOR WHEEZING ORSHORTNESS OF BREATH. 02/01/15   Salley Scarlet, MD  SUBOXONE 8-2 MG FILM  11/12/13   [provider]  sulfamethoxazole-trimethoprim (BACTRIM DS) 800-160 MG tablet Take 1 tablet by mouth 2 (two) times daily. Take 1 bid 09/07/19   Adline Potter, NP  Tetrahydrozoline HCl (VISINE OP) Place 1 drop into both eyes daily as needed (dry eyes).    [provider]  clonazePAM (KLONOPIN) 1 MG tablet Take 1 tablet (1 mg total) by mouth 2 (two) times daily as needed for anxiety. 01/15/11 03/30/11  Salley Scarlet, MD      Allergies    Penicillins, Bee venom, Cephalexin, Lansoprazole, and Tramadol    Review of Systems   Review of Systems  All other systems reviewed and are negative.   Physical Exam Updated Vital  Signs BP (!) 131/42 (BP Location: Right Arm)   Pulse 86   Temp 98.6 F (37 C) (Oral)   Resp 16   Ht 1.778 m (5\' 10" )   Wt 110.7 kg   LMP 02/22/2013   SpO2 97%   BMI 35.01 kg/m  Physical Exam Vitals and nursing note reviewed.  Constitutional:      General: She is not in acute distress.    Appearance: She is well-developed.  HENT:     Head: Normocephalic and atraumatic.     Mouth/Throat:     Pharynx: No oropharyngeal exudate.  Eyes:     General: No scleral icterus.       Right eye: No discharge.        Left eye: No discharge.     Conjunctiva/sclera: Conjunctivae normal.     Pupils: Pupils  are equal, round, and reactive to light.  Neck:     Thyroid: No thyromegaly.     Vascular: No JVD.  Cardiovascular:     Rate and Rhythm: Normal rate and regular rhythm.     Heart sounds: Normal heart sounds. No murmur heard.    No friction rub. No gallop.  Pulmonary:     Effort: Pulmonary effort is normal. No respiratory distress.     Breath sounds: Normal breath sounds. No wheezing or rales.  Abdominal:     General: Bowel sounds are normal. There is no distension.     Palpations: Abdomen is soft. There is no mass.     Tenderness: There is no abdominal tenderness.  Musculoskeletal:        General: No tenderness. Normal range of motion.     Cervical back: Normal range of motion and neck supple.     Right lower leg: Edema present.     Left lower leg: Edema present.     Comments: There is slight asymmetry of the right lower extremities at the calf, there is a joint effusion on the left of the knee but is minimally tender.  Her right lower extremity has what appears to be some stasis dermatitis or even a contact dermatitis that extends from the ankle up to the mid anterior lower extremity below the knee.  It is not circumferential, there is no defined border, it is not serpiginous, it is not desquamating or vesicular.  It is more red and papular.  There is no petechiae or purpura.  There is no tenderness, it is slightly warm  Lymphadenopathy:     Cervical: No cervical adenopathy.  Skin:    General: Skin is warm and dry.     Findings: No erythema or rash.  Neurological:     Mental Status: She is alert.     Coordination: Coordination normal.  Psychiatric:        Behavior: Behavior normal.     ED Results / Procedures / Treatments   Labs (all labs ordered are listed, but only abnormal results are displayed) Labs Reviewed - No data to display  EKG None  Radiology No results found.  Procedures Procedures    Medications Ordered in ED Medications - No data to display  ED  Course/ Medical Decision Making/ A&P                             Medical Decision Making  This patient presents with what appears to be a dermatitis, she is only placing the steroid and Benadryl creams on it, there is  been no triple antibiotic ointment.  She has no signs of induration or tenderness, there is a slight asymmetry of the legs and I will have her come back for an ultrasound given her recent injury to the leg.  She has agreed to come back in the morning for that.  Additionally I will place her on a stronger betamethasone ointment which will help, she can follow-up with her family doctor and is agreeable to the plan.  I encouraged her to use a prednisone course for 5 days if the ultrasound is negative tomorrow.        Final Clinical Impression(s) / ED Diagnoses Final diagnoses:  Irritant contact dermatitis, unspecified trigger    Rx / DC Orders ED Discharge Orders          Ordered    betamethasone dipropionate (DIPROLENE) 0.05 % ointment  2 times daily        05/27/22 2208    predniSONE (DELTASONE) 20 MG tablet  Daily        05/27/22 2208    US Venous Img Lower Unilateral Right        05/27/22 2208              Eber Hong, MD 05/27/22 2209

## 2022-05-27 NOTE — Discharge Instructions (Signed)
I would like for you to follow-up in the morning for an ultrasound, please see the paperwork and the phone number to call to arrange the time to come in.  Please stay out of work tomorrow so that you can facilitate getting the ultrasound done.  In the meantime if your ultrasound is positive for a blood clot you will need a blood thinner which we will take care of tomorrow.  If your ultrasound is negative then I want you to take the prednisone daily for 5 days.  I have also prescribed a different topical steroid called betamethasone which you can apply twice daily to this area.  Thank you for allowing [place] to treat you in the emergency department today.  After reviewing your examination and potential testing that was done it appears that you are safe to go home.  I would like for you to follow-up with your doctor within the next several days, have them obtain your results and follow-up with them to review all of these tests.  If you should develop severe or worsening symptoms return to the emergency department immediately

## 2022-05-28 ENCOUNTER — Ambulatory Visit (HOSPITAL_COMMUNITY)
Admission: RE | Admit: 2022-05-28 | Discharge: 2022-05-28 | Disposition: A | Discharge status: Home / Self Care | Payer: Medicaid Other | Source: Ambulatory Visit | Attending: Emergency Medicine | Admitting: Emergency Medicine

## 2022-05-28 DIAGNOSIS — L249 Irritant contact dermatitis, unspecified cause: Secondary | ICD-10-CM

## 2022-06-08 ENCOUNTER — Encounter: Payer: Self-pay | Admitting: Orthopedic Surgery

## 2022-06-08 ENCOUNTER — Ambulatory Visit (INDEPENDENT_AMBULATORY_CARE_PROVIDER_SITE_OTHER): Payer: Medicaid Other | Admitting: Orthopedic Surgery

## 2022-06-08 ENCOUNTER — Other Ambulatory Visit (INDEPENDENT_AMBULATORY_CARE_PROVIDER_SITE_OTHER): Payer: Medicaid Other

## 2022-06-08 VITALS — BP 144/61 | HR 93 | Ht 70.0 in | Wt 244.0 lb

## 2022-06-08 DIAGNOSIS — M1711 Unilateral primary osteoarthritis, right knee: Secondary | ICD-10-CM | POA: Diagnosis not present

## 2022-06-08 DIAGNOSIS — M25462 Effusion, left knee: Secondary | ICD-10-CM

## 2022-06-08 DIAGNOSIS — M170 Bilateral primary osteoarthritis of knee: Secondary | ICD-10-CM

## 2022-06-08 DIAGNOSIS — M25561 Pain in right knee: Secondary | ICD-10-CM

## 2022-06-08 DIAGNOSIS — G8929 Other chronic pain: Secondary | ICD-10-CM

## 2022-06-08 DIAGNOSIS — M1712 Unilateral primary osteoarthritis, left knee: Secondary | ICD-10-CM

## 2022-06-08 MED ORDER — METHYLPREDNISOLONE ACETATE 40 MG/ML IJ SUSP: 40.0000 mg | Freq: Once | INTRAMUSCULAR | Status: DC

## 2022-06-08 MED ORDER — METHYLPREDNISOLONE ACETATE 40 MG/ML IJ SUSP
40.0000 mg | Freq: Once | INTRAMUSCULAR | Status: AC
  Administered 2022-06-08: 40 mg via INTRA_ARTICULAR

## 2022-06-08 MED ORDER — MELOXICAM 7.5 MG PO TABS
7.5000 mg | ORAL_TABLET | Freq: Every day | ORAL | 5 refills | Status: DC
Start: 2022-06-08 — End: 2023-03-06

## 2022-06-08 NOTE — Progress Notes (Signed)
Chief Complaint  Patient presents with   Knee Pain    Larey Seat in April pain in left knee  Right knee has pain as well    47 year old female with fall in April x-ray showed some mild arthritis trace joint effusion she says the right knee is also bothering her so we will x-ray that 1 as well  She is not on any medication for her knee pain  She says she feels like she may need injections  Examinations of both knees show full range of motion normal strength and stability no ligamentous instability normal hip exams  No joint effusion detected  Tenderness on the medial joint line in the left knee and the range of motion was painful throughout  X-ray left knee outside facility.  My interpretation of that x-ray is that there is mild arthritis with trace effusion  Right knee x-ray was done in the office mild OA grade 2  Impression  Encounter Diagnoses  Name Primary?   Acute pain of right knee    Primary osteoarthritis of left knee Yes   Effusion, left knee    Primary osteoarthritis of right knee     47 year old female mild arthritis both left and right knee no prior treatment  Recommend arthritis medication plus or minus injection per patient decision  Return as needed  Meds ordered this encounter  Medications   meloxicam (MOBIC) 7.5 MG tablet    Sig: Take 1 tablet (7.5 mg total) by mouth daily.    Dispense:  30 tablet    Refill:  5   Procedure note for bilateral knee injections  Procedure note left knee injection verbal consent was obtained to inject left knee joint  Timeout was completed to confirm the site of injection  The medications used were 40 mg depomedrol and 3 cc of 1% lidocaine  Anesthesia was provided by ethyl chloride and the skin was prepped with alcohol.  After cleaning the skin with alcohol a 20-gauge needle was used to inject the left knee joint. There were no complications. A sterile bandage was applied.   Procedure note right knee injection verbal  consent was obtained to inject right knee joint  Timeout was completed to confirm the site of injection  The medications used were 40 mg depomedrol and 3 cc of 1% lidocaine  Anesthesia was provided by ethyl chloride and the skin was prepped with alcohol.  After cleaning the skin with alcohol a 20-gauge needle was used to inject the right knee joint. There were no complications. A sterile bandage was applied.

## 2022-06-08 NOTE — Addendum Note (Signed)
Addended by: Michaele Offer on: 06/08/2022 10:40 AM   Modules accepted: Orders

## 2022-08-09 ENCOUNTER — Ambulatory Visit (INDEPENDENT_AMBULATORY_CARE_PROVIDER_SITE_OTHER): Payer: MEDICAID | Admitting: Orthopedic Surgery

## 2022-08-09 ENCOUNTER — Encounter: Payer: Self-pay | Admitting: Orthopedic Surgery

## 2022-08-09 VITALS — Ht 70.0 in | Wt 244.0 lb

## 2022-08-09 DIAGNOSIS — M170 Bilateral primary osteoarthritis of knee: Secondary | ICD-10-CM | POA: Diagnosis not present

## 2022-08-09 DIAGNOSIS — M1712 Unilateral primary osteoarthritis, left knee: Secondary | ICD-10-CM

## 2022-08-09 DIAGNOSIS — M1711 Unilateral primary osteoarthritis, right knee: Secondary | ICD-10-CM

## 2022-08-09 MED ORDER — METHYLPREDNISOLONE ACETATE 40 MG/ML IJ SUSP
40.0000 mg | Freq: Once | INTRAMUSCULAR | Status: AC
  Administered 2022-08-09: 40 mg via INTRA_ARTICULAR

## 2022-08-09 NOTE — Patient Instructions (Signed)
You have received an injection of steroids into the joint. 15% of patients will have increased pain within the 24 hours postinjection.   This is transient and will go away.   We recommend that you use ice packs on the injection site for 20 minutes every 2 hours and extra strength Tylenol 2 tablets every 8 as needed until the pain resolves.  If you continue to have pain after taking the Tylenol and using the ice please call the office for further instructions.  

## 2022-08-09 NOTE — Progress Notes (Signed)
Chief Complaint  Patient presents with   Injections    Both knees      The patient has requested an injection   Chief Complaint  Patient presents with   Injections    Both knees      Encounter Diagnoses  Name Primary?   Primary osteoarthritis of left knee Yes   Primary osteoarthritis of right knee        Procedure note for bilateral knee injections  Procedure note left knee injection verbal consent was obtained to inject left knee joint  Timeout was completed to confirm the site of injection  The medications used were 40 mg depomedrol and 3 cc of 1% lidocaine  Anesthesia was provided by ethyl chloride and the skin was prepped with alcohol.  After cleaning the skin with alcohol a 20-gauge needle was used to inject the left knee joint. There were no complications. A sterile bandage was applied.   Procedure note right knee injection verbal consent was obtained to inject right knee joint  Timeout was completed to confirm the site of injection  The medications used were 40 mg depomedrol and 3 cc of 1% lidocaine  Anesthesia was provided by ethyl chloride and the skin was prepped with alcohol.  After cleaning the skin with alcohol a 20-gauge needle was used to inject the right knee joint. There were no complications. A sterile bandage was applied. Marland Kitchen

## 2022-08-29 ENCOUNTER — Other Ambulatory Visit (HOSPITAL_COMMUNITY): Payer: Self-pay | Admitting: Family Medicine

## 2022-08-29 DIAGNOSIS — R600 Localized edema: Secondary | ICD-10-CM

## 2022-08-30 ENCOUNTER — Ambulatory Visit (HOSPITAL_COMMUNITY)
Admission: RE | Admit: 2022-08-30 | Discharge: 2022-08-30 | Disposition: A | Discharge status: Home / Self Care | Payer: MEDICAID | Source: Ambulatory Visit | Attending: Family Medicine | Admitting: Family Medicine

## 2022-08-30 DIAGNOSIS — R600 Localized edema: Secondary | ICD-10-CM | POA: Insufficient documentation

## 2022-10-16 ENCOUNTER — Telehealth: Payer: Self-pay | Admitting: Orthopedic Surgery

## 2022-10-16 NOTE — Telephone Encounter (Signed)
This patient is requesting injections again, it hasn't been 3 months.  She stated you told her she could get them earlier, I do not see that in the note.  Please advise.  [phone]

## 2022-10-25 ENCOUNTER — Ambulatory Visit: Payer: MEDICAID | Admitting: Orthopedic Surgery

## 2022-10-25 DIAGNOSIS — M1712 Unilateral primary osteoarthritis, left knee: Secondary | ICD-10-CM

## 2022-10-25 DIAGNOSIS — M170 Bilateral primary osteoarthritis of knee: Secondary | ICD-10-CM

## 2022-10-25 DIAGNOSIS — M1711 Unilateral primary osteoarthritis, right knee: Secondary | ICD-10-CM

## 2022-10-25 MED ORDER — METHYLPREDNISOLONE ACETATE 40 MG/ML IJ SUSP
40.0000 mg | Freq: Once | INTRAMUSCULAR | Status: AC
  Administered 2022-10-25: 40 mg via INTRA_ARTICULAR

## 2022-10-25 NOTE — Progress Notes (Signed)
Follow-up visit for bilateral knee injections history of bilateral knee osteoarthritis still managing weight issue some of it is fluid needs to diuresis  47 years old too young for knee replacement  Encounter Diagnoses  Name Primary?   Primary osteoarthritis of left knee Yes   Primary osteoarthritis of right knee     Procedure note for bilateral knee injections  Procedure note left knee injection verbal consent was obtained to inject left knee joint  Timeout was completed to confirm the site of injection  The medications used were 40 mg depomedrol and 3 cc of 1% lidocaine  Anesthesia was provided by ethyl chloride and the skin was prepped with alcohol.  After cleaning the skin with alcohol a 20-gauge needle was used to inject the left knee joint. There were no complications. A sterile bandage was applied.   Procedure note right knee injection verbal consent was obtained to inject right knee joint  Timeout was completed to confirm the site of injection  The medications used were 40 mg depomedrol and 3 cc of 1% lidocaine  Anesthesia was provided by ethyl chloride and the skin was prepped with alcohol.  After cleaning the skin with alcohol a 20-gauge needle was used to inject the right knee joint. There were no complications. A sterile bandage was applied.

## 2022-10-25 NOTE — Patient Instructions (Signed)
You have received an injection of steroids into the joint. 15% of patients will have increased pain within the 24 hours postinjection.   This is transient and will go away.   We recommend that you use ice packs on the injection site for 20 minutes every 2 hours and extra strength Tylenol 2 tablets every 8 as needed until the pain resolves.  If you continue to have pain after taking the Tylenol and using the ice please call the office for further instructions.

## 2022-12-12 ENCOUNTER — Other Ambulatory Visit (HOSPITAL_COMMUNITY): Payer: Self-pay | Admitting: Adult Health

## 2022-12-12 DIAGNOSIS — Z1231 Encounter for screening mammogram for malignant neoplasm of breast: Secondary | ICD-10-CM

## 2022-12-13 ENCOUNTER — Encounter: Payer: Self-pay | Admitting: *Deleted

## 2022-12-19 ENCOUNTER — Ambulatory Visit (HOSPITAL_COMMUNITY): Payer: MEDICAID

## 2023-01-07 ENCOUNTER — Telehealth: Payer: Self-pay | Admitting: Orthopedic Surgery

## 2023-01-07 NOTE — Telephone Encounter (Signed)
Dr. Mort Sawyers pt - spoke w/the patient, she is requesting bil knee injections, she had them last on 10/17, she said to ask Dr. Romeo Apple that he'll let her get them before 3 months, please advise.

## 2023-01-11 ENCOUNTER — Ambulatory Visit: Payer: MEDICAID | Admitting: Orthopedic Surgery

## 2023-02-01 ENCOUNTER — Encounter: Payer: Self-pay | Admitting: Orthopedic Surgery

## 2023-02-01 ENCOUNTER — Ambulatory Visit (INDEPENDENT_AMBULATORY_CARE_PROVIDER_SITE_OTHER): Payer: MEDICAID | Admitting: Orthopedic Surgery

## 2023-02-01 DIAGNOSIS — R202 Paresthesia of skin: Secondary | ICD-10-CM | POA: Diagnosis not present

## 2023-02-01 DIAGNOSIS — M170 Bilateral primary osteoarthritis of knee: Secondary | ICD-10-CM

## 2023-02-01 DIAGNOSIS — M25462 Effusion, left knee: Secondary | ICD-10-CM

## 2023-02-01 DIAGNOSIS — M1711 Unilateral primary osteoarthritis, right knee: Secondary | ICD-10-CM

## 2023-02-01 DIAGNOSIS — G5603 Carpal tunnel syndrome, bilateral upper limbs: Secondary | ICD-10-CM

## 2023-02-01 DIAGNOSIS — M1712 Unilateral primary osteoarthritis, left knee: Secondary | ICD-10-CM

## 2023-02-01 NOTE — Progress Notes (Signed)
   Chief Complaint  Patient presents with   Injections    Both knees    Also complains of bilateral carpal tunnel like symptoms  She complains of pain paresthesias in the right and left upper extremity she says my hands will not wake up.  She is on gabapentin which help with the numbness but the pain has can continued.  She was scheduled for nerve conduction studies but could not get to [facility] to get the testing done  Also would like to have both knees injected  Encounter Diagnoses  Name Primary?   Effusion, left knee Yes   Primary osteoarthritis of left knee    Primary osteoarthritis of right knee    Bilateral carpal tunnel syndrome    Paresthesia of hand, bilateral    Procedure note for bilateral knee injections  Procedure note left knee injection verbal consent was obtained to inject left knee joint  Timeout was completed to confirm the site of injection  The medications used were 40 mg depomedrol and 3 cc of 1% lidocaine  Anesthesia was provided by ethyl chloride and the skin was prepped with alcohol.  After cleaning the skin with alcohol a 20-gauge needle was used to inject the left knee joint. There were no complications. A sterile bandage was applied.   Procedure note right knee injection verbal consent was obtained to inject right knee joint  Timeout was completed to confirm the site of injection  The medications used were 40 mg depomedrol and 3 cc of 1% lidocaine  Anesthesia was provided by ethyl chloride and the skin was prepped with alcohol.  After cleaning the skin with alcohol a 20-gauge needle was used to inject the right knee joint. There were no complications. A sterile bandage was applied.   Recommend nerve conduction studies

## 2023-02-01 NOTE — Patient Instructions (Signed)
You have received an injection of steroids into the joint. 15% of patients will have increased pain within the 24 hours postinjection.   This is transient and will go away.   We recommend that you use ice packs on the injection site for 20 minutes every 2 hours and extra strength Tylenol 2 tablets every 8 as needed until the pain resolves.  If you continue to have pain after taking the Tylenol and using the ice please call the office for further instructions.

## 2023-02-06 ENCOUNTER — Ambulatory Visit (HOSPITAL_COMMUNITY)
Admission: RE | Admit: 2023-02-06 | Discharge: 2023-02-06 | Disposition: A | Discharge status: Home / Self Care | Payer: MEDICAID | Source: Ambulatory Visit | Attending: Adult Health | Admitting: Adult Health

## 2023-02-06 ENCOUNTER — Encounter (HOSPITAL_COMMUNITY): Payer: Self-pay

## 2023-02-06 DIAGNOSIS — Z1231 Encounter for screening mammogram for malignant neoplasm of breast: Secondary | ICD-10-CM | POA: Diagnosis present

## 2023-02-08 ENCOUNTER — Ambulatory Visit: Payer: MEDICAID | Admitting: Orthopedic Surgery

## 2023-02-21 ENCOUNTER — Other Ambulatory Visit (HOSPITAL_COMMUNITY): Payer: Self-pay | Admitting: Adult Health

## 2023-02-21 DIAGNOSIS — R928 Other abnormal and inconclusive findings on diagnostic imaging of breast: Secondary | ICD-10-CM

## 2023-02-22 ENCOUNTER — Encounter: Payer: Self-pay | Admitting: Physical Medicine and Rehabilitation

## 2023-02-22 ENCOUNTER — Ambulatory Visit: Payer: MEDICAID | Admitting: Physical Medicine and Rehabilitation

## 2023-02-22 DIAGNOSIS — R531 Weakness: Secondary | ICD-10-CM | POA: Diagnosis not present

## 2023-02-22 DIAGNOSIS — M79642 Pain in left hand: Secondary | ICD-10-CM

## 2023-02-22 DIAGNOSIS — M25532 Pain in left wrist: Secondary | ICD-10-CM

## 2023-02-22 DIAGNOSIS — M79641 Pain in right hand: Secondary | ICD-10-CM

## 2023-02-22 DIAGNOSIS — M25531 Pain in right wrist: Secondary | ICD-10-CM

## 2023-02-22 DIAGNOSIS — R202 Paresthesia of skin: Secondary | ICD-10-CM

## 2023-02-22 NOTE — Progress Notes (Signed)
Functional Pain Scale - descriptive words and definitions  Distracting (5)    Aware of pain/able to complete some ADL's but limited by pain/sleep is affected and active distractions are only slightly useful. Moderate range order  Average Pain 5  Bilat NCS, She has numbness, burning sensation, stiffness and throbbing. She Drops thing when hands stiffen up and has difficulty grasping when they stiffen. She is right handed.
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
## 2023-02-25 NOTE — Procedures (Unsigned)
 EMG & NCV Findings: Evaluation of the left median motor nerve showed reduced amplitude (4.1 mV).  The right median motor nerve showed prolonged distal onset latency (4.4 ms) and decreased conduction velocity (Elbow-Wrist, 49 m/s).  The right median (across palm) sensory nerve showed prolonged distal peak latency (Wrist, 4.1 ms).  All remaining nerves (as indicated in the following tables) were within normal limits.  Left vs. Right side comparison data for the median motor nerve indicates abnormal L-R latency difference (0.8 ms).  The ulnar motor nerve indicates abnormal L-R amplitude difference (38.2 %), abnormal L-R velocity difference (B Elbow-Wrist, 21 m/s), and abnormal L-R velocity difference (A Elbow-B Elbow, 18 m/s).  All remaining left vs. right side differences were within normal limits.    All examined muscles (as indicated in the following table) showed no evidence of electrical instability.    Impression: The above electrodiagnostic study is ABNORMAL and reveals evidence of a moderate right median nerve entrapment at the wrist (carpal tunnel syndrome) affecting sensory and motor components.   There is no significant electrodiagnostic evidence of any other focal nerve entrapment, brachial plexopathy or cervical radiculopathy.   Recommendations: 1.  Follow-up with referring physician. 2.  Continue current management of symptoms. 3.  Continue use of resting splint at night-time and as needed during the day. 4.  Suggest surgical evaluation.  ___________________________ Shannon Reese FAAPMR Board Certified, American Board of Physical Medicine and Rehabilitation    Nerve Conduction Studies Anti Sensory Summary Table   Stim Site NR Peak (ms) Norm Peak (ms) P-T Amp (V) Norm P-T Amp Site1 Site2 Delta-P (ms) Dist (cm) Vel (m/s) Norm Vel (m/s)  Left Median Acr Palm Anti Sensory (2nd Digit)  31.7C  Wrist    3.5 <3.6 30.1 >10 Wrist Palm 1.7 0.0    Palm    1.8 <2.0 6.8         Right Median  Acr Palm Anti Sensory (2nd Digit)  30.1C  Wrist    *4.1 <3.6 21.8 >10 Wrist Palm 2.3 0.0    Palm    1.8 <2.0 19.5         Left Radial Anti Sensory (Base 1st Digit)  32C  Wrist    1.8 <3.1 38.9  Wrist Base 1st Digit 1.8 0.0    Right Radial Anti Sensory (Base 1st Digit)  30.6C  Wrist    2.0 <3.1 35.3  Wrist Base 1st Digit 2.0 0.0    Left Ulnar Anti Sensory (5th Digit)  32.1C  Wrist    3.2 <3.7 18.8 >15.0 Wrist 5th Digit 3.2 14.0 44 >38  Right Ulnar Anti Sensory (5th Digit)  30.8C  Wrist    2.9 <3.7 23.0 >15.0 Wrist 5th Digit 2.9 14.0 48 >38   Motor Summary Table   Stim Site NR Onset (ms) Norm Onset (ms) O-P Amp (mV) Norm O-P Amp Site1 Site2 Delta-0 (ms) Dist (cm) Vel (m/s) Norm Vel (m/s)  Left Median Motor (Abd Poll Brev)  32C  Wrist    3.6 <4.2 *4.1 >5 Elbow Wrist 4.1 21.0 51 >50  Elbow    7.7  1.9         Right Median Motor (Abd Poll Brev)  30.8C  Wrist    *4.4 <4.2 7.2 >5 Elbow Wrist 4.3 21.0 *49 >50  Elbow    8.7  7.2         Left Ulnar Motor (Abd Dig Min)  32.2C  Wrist    3.0 <4.2 6.3 >3 B Elbow Wrist  2.6 20.0 77 >53  B Elbow    5.6  5.5  A Elbow B Elbow 1.7 10.0 59 >53  A Elbow    7.3  6.4         Right Ulnar Motor (Abd Dig Min)  30.9C  Wrist    2.8 <4.2 10.2 >3 B Elbow Wrist 3.6 20.0 56 >53  B Elbow    6.4  3.3  A Elbow B Elbow 1.3 10.0 77 >53  A Elbow    7.7  3.8          EMG   Side Muscle Nerve Root Ins Act Fibs Psw Amp Dur Poly Recrt Int Dennie Bible Comment  Right Abd Poll Brev Median C8-T1 Nml Nml Nml Nml Nml 0 Nml Nml   Right 1stDorInt Ulnar C8-T1 Nml Nml Nml Nml Nml 0 Nml Nml   Right PronatorTeres Median C6-7 Nml Nml Nml Nml Nml 0 Nml Nml   Right Biceps Musculocut C5-6 Nml Nml Nml Nml Nml 0 Nml Nml   Right Deltoid Axillary C5-6 Nml Nml Nml Nml Nml 0 Nml Nml     Nerve Conduction Studies Anti Sensory Left/Right Comparison   Stim Site L Lat (ms) R Lat (ms) L-R Lat (ms) L Amp (V) R Amp (V) L-R Amp (%) Site1 Site2 L Vel (m/s) R Vel (m/s) L-R Vel (m/s)  Median Acr  Palm Anti Sensory (2nd Digit)  31.7C  Wrist 3.5 *4.1 0.6 30.1 21.8 27.6 Wrist Palm     Palm 1.8 1.8 0.0 6.8 19.5 65.1       Radial Anti Sensory (Base 1st Digit)  32C  Wrist 1.8 2.0 0.2 38.9 35.3 9.3 Wrist Base 1st Digit     Ulnar Anti Sensory (5th Digit)  32.1C  Wrist 3.2 2.9 0.3 18.8 23.0 18.3 Wrist 5th Digit 44 48 4   Motor Left/Right Comparison   Stim Site L Lat (ms) R Lat (ms) L-R Lat (ms) L Amp (mV) R Amp (mV) L-R Amp (%) Site1 Site2 L Vel (m/s) R Vel (m/s) L-R Vel (m/s)  Median Motor (Abd Poll Brev)  32C  Wrist 3.6 *4.4 *0.8 *4.1 7.2 43.1 Elbow Wrist 51 *49 2  Elbow 7.7 8.7 1.0 1.9 7.2 73.6       Ulnar Motor (Abd Dig Min)  32.2C  Wrist 3.0 2.8 0.2 6.3 10.2 *38.2 B Elbow Wrist 77 56 *21  B Elbow 5.6 6.4 0.8 5.5 3.3 40.0 A Elbow B Elbow 59 77 *18  A Elbow 7.3 7.7 0.4 6.4 3.8 40.6          Waveforms:

## 2023-02-28 ENCOUNTER — Ambulatory Visit (HOSPITAL_COMMUNITY)
Admission: RE | Admit: 2023-02-28 | Discharge: 2023-02-28 | Discharge status: Home / Self Care | Payer: MEDICAID | Source: Ambulatory Visit | Attending: Adult Health

## 2023-02-28 ENCOUNTER — Ambulatory Visit (HOSPITAL_COMMUNITY)
Admission: RE | Admit: 2023-02-28 | Discharge: 2023-02-28 | Disposition: A | Discharge status: Home / Self Care | Payer: MEDICAID | Source: Ambulatory Visit | Attending: Adult Health | Admitting: Adult Health

## 2023-02-28 DIAGNOSIS — R928 Other abnormal and inconclusive findings on diagnostic imaging of breast: Secondary | ICD-10-CM | POA: Insufficient documentation

## 2023-03-06 ENCOUNTER — Encounter: Payer: Self-pay | Admitting: *Deleted

## 2023-03-06 ENCOUNTER — Ambulatory Visit (INDEPENDENT_AMBULATORY_CARE_PROVIDER_SITE_OTHER): Payer: MEDICAID | Admitting: Gastroenterology

## 2023-03-06 ENCOUNTER — Other Ambulatory Visit: Payer: Self-pay | Admitting: *Deleted

## 2023-03-06 ENCOUNTER — Ambulatory Visit: Payer: MEDICAID | Admitting: Gastroenterology

## 2023-03-06 ENCOUNTER — Encounter: Payer: Self-pay | Admitting: Gastroenterology

## 2023-03-06 VITALS — BP 107/69 | HR 80 | Temp 98.5°F | Ht 70.0 in | Wt 274.2 lb

## 2023-03-06 DIAGNOSIS — R1319 Other dysphagia: Secondary | ICD-10-CM | POA: Insufficient documentation

## 2023-03-06 DIAGNOSIS — R1310 Dysphagia, unspecified: Secondary | ICD-10-CM | POA: Diagnosis not present

## 2023-03-06 DIAGNOSIS — K219 Gastro-esophageal reflux disease without esophagitis: Secondary | ICD-10-CM

## 2023-03-06 DIAGNOSIS — Z1211 Encounter for screening for malignant neoplasm of colon: Secondary | ICD-10-CM | POA: Insufficient documentation

## 2023-03-06 MED ORDER — PEG 3350-KCL-NA BICARB-NACL 420 G PO SOLR: 4000.0000 mL | Freq: Once | ORAL | 0 refills | Status: AC
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
## 2023-03-06 NOTE — Progress Notes (Deleted)
 GI Office Note    Referring Provider: Katherine Basset* Primary Care Physician:  Kara Pacer, NP  Primary Gastroenterologist:  Chief Complaint   No chief complaint on file.    History of Present Illness   Shannon Reese is a 48 y.o. female presenting today at the request of Colleen Can, NP for further evaluation of GERD and screening colonoscopy.  She has h/o "cancer cells detected via biopsy in her throat via Dr. Barbette Merino, told to f/u every year, last f/u was over 7 years ago".    ***check for labs Substance abuse adhd Obesity B12 def Depr/anx Copd [facility] Tubal hysterectomy hypothyroid  Medications   Current Outpatient Medications  Medication Sig Dispense Refill   ABILIFY MAINTENA 300 MG PRSY prefilled syringe Inject into the muscle every 30 (thirty) days.     ADDERALL XR 30 MG 24 hr capsule Take 30 mg by mouth every morning.     ALPRAZolam (XANAX) 0.5 MG tablet TAKE (1) TABLET BY MOUTH (3) TIMES DAILY AS NEEDED FOR ANXIETY/SLEEP.MUST LAST 30 DAYS. 90 tablet 1   betamethasone dipropionate (DIPROLENE) 0.05 % ointment Apply topically 2 (two) times daily. 30 g 0   diphenoxylate-atropine (LOMOTIL) 2.5-0.025 MG tablet Take 2 tablets by mouth 4 (four) times daily as needed for diarrhea or loose stools. 30 tablet 0   docusate sodium (COLACE) 100 MG capsule Take 100 mg by mouth daily.     escitalopram (LEXAPRO) 20 MG tablet Take by mouth.     furosemide (LASIX) 40 MG tablet TAKE 0.5 TABLETS EVERY DAY BY ORAL ROUTE AS NEEDED FOR 30 DAYS.     gabapentin (NEURONTIN) 400 MG capsule Take 400 mg by mouth 3 (three) times daily.     ibuprofen (ADVIL) 200 MG tablet Take 200 mg by mouth every 6 (six) hours as needed.     INVEGA 3 MG 24 hr tablet TAKE 1 TABLET (3 MG) BY MOUTH DAILY IN THE MORNING     INVEGA SUSTENNA 156 MG/ML SUSY injection Inject into the muscle.     levothyroxine (SYNTHROID) 25 MCG tablet Take 25 mcg by mouth daily.      meloxicam (MOBIC) 7.5 MG tablet Take 1 tablet (7.5 mg total) by mouth daily. 30 tablet 5   naloxone (NARCAN) nasal spray 4 mg/0.1 mL SPRAY 1 SPRAY INTO NOSTRIL ONE TIME     omeprazole (PRILOSEC) 20 MG capsule Take 20 mg by mouth 2 (two) times daily.     PROAIR HFA 108 (90 Base) MCG/ACT inhaler INHALE 2 PUFFS INTO THE LUNGS EVERY 6 HOURS AS NEEDED FOR WHEEZING ORSHORTNESS OF BREATH. 8.5 g 0   QUEtiapine (SEROQUEL) 400 MG tablet Take 400 mg by mouth at bedtime.     SUBOXONE 8-2 MG FILM   0   VYVANSE 50 MG capsule Take 50 mg by mouth every morning.     No current facility-administered medications for this visit.    Allergies   Allergies as of 03/06/2023 - Review Complete 02/22/2023  Allergen Reaction Noted   Penicillins Anaphylaxis    Bee venom Swelling 08/19/2011   Cephalexin Hives and Swelling    Lansoprazole Hives and Swelling    Tramadol Palpitations 08/19/2011    Past Medical History   Past Medical History:  Diagnosis Date   Anxiety    Arthritis    Bipolar disorder (HCC)    Chronic pain    COPD (chronic obstructive pulmonary disease) (HCC)    Facet joint disease MRI  2012   GERD (gastroesophageal reflux disease)    Low back pain    Narcotic abuse (HCC)    Drug seeking behavior    Past Surgical History   Past Surgical History:  Procedure Laterality Date   CRYOABLATION     DENTAL SURGERY     TUBAL LIGATION     VAGINAL HYSTERECTOMY N/A 03/04/2013   Procedure: HYSTERECTOMY VAGINAL;  Surgeon: Lazaro Arms, MD;  Location: [facility];  Service: Gynecology;  Laterality: N/A;    Past Family History   Family History  Problem Relation Age of Onset   Depression Mother    Arthritis Mother    Heart disease Father    Diabetes Father    COPD Father    COPD Sister    Arthritis Sister    Alcohol abuse Sister    Breast cancer Maternal Aunt    Hypertension Brother    Diabetes Brother     Past Social History   Social History   Socioeconomic History   Marital status:  Single    Spouse name: Not on file   Number of children: Not on file   Years of education: Not on file   Highest education level: Not on file  Occupational History   Not on file  Tobacco Use   Smoking status: Every Day    Current packs/day: 1.50    Average packs/day: 1.5 packs/day for 21.0 years (31.5 ttl pk-yrs)    Types: Cigarettes   Smokeless tobacco: Never  Vaping Use   Vaping status: Every Day  Substance and Sexual Activity   Alcohol use: No   Drug use: No   Sexual activity: Yes    Birth control/protection: Surgical    Comment: hyst  Other Topics Concern   Not on file  Social History Narrative   Not on file   Social Drivers of Health   Financial Resource Strain: Low Risk  (09/04/2019)   Overall Financial Resource Strain (CARDIA)    Difficulty of Paying Living Expenses: Not very hard  Food Insecurity: No Food Insecurity (09/04/2019)   Hunger Vital Sign    Worried About Running Out of Food in the Last Year: Never true    Ran Out of Food in the Last Year: Never true  Transportation Needs: No Transportation Needs (09/04/2019)   PRAPARE - Administrator, Civil Service (Medical): No    Lack of Transportation (Non-Medical): No  Physical Activity: Sufficiently Active (09/04/2019)   Exercise Vital Sign    Days of Exercise per Week: 3 days    Minutes of Exercise per Session: 60 min  Stress: Stress Concern Present (09/04/2019)   [employer] of Occupational Health - Occupational Stress Questionnaire    Feeling of Stress : To some extent  Social Connections: Moderately Integrated (09/04/2019)   Social Connection and Isolation Panel [NHANES]    Frequency of Communication with Friends and Family: More than three times a week    Frequency of Social Gatherings with Friends and Family: Twice a week    Attends Religious Services: More than 4 times per year    Active Member of [employer] or Organizations: No    Attends Banker Meetings: Never    Marital  Status: Living with partner  Intimate Partner Violence: Not At Risk (09/04/2019)   Humiliation, Afraid, Rape, and Kick questionnaire    Fear of Current or Ex-Partner: No    Emotionally Abused: No    Physically Abused: No    Sexually Abused:  No    Review of Systems   General: Negative for anorexia, weight loss, fever, chills, fatigue, weakness. Eyes: Negative for vision changes.  ENT: Negative for hoarseness, difficulty swallowing , nasal congestion. CV: Negative for chest pain, angina, palpitations, dyspnea on exertion, peripheral edema.  Respiratory: Negative for dyspnea at rest, dyspnea on exertion, cough, sputum, wheezing.  GI: See history of present illness. GU:  Negative for dysuria, hematuria, urinary incontinence, urinary frequency, nocturnal urination.  MS: Negative for joint pain, low back pain.  Derm: Negative for rash or itching.  Neuro: Negative for weakness, abnormal sensation, seizure, frequent headaches, memory loss,  confusion.  Psych: Negative for anxiety, depression, suicidal ideation, hallucinations.  Endo: Negative for unusual weight change.  Heme: Negative for bruising or bleeding. Allergy: Negative for rash or hives.  Physical Exam   LMP 02/22/2013    General: Well-nourished, well-developed in no acute distress.  Head: Normocephalic, atraumatic.   Eyes: Conjunctiva pink, no icterus. Mouth: Oropharyngeal mucosa moist and pink , no lesions erythema or exudate. Neck: Supple without thyromegaly, masses, or lymphadenopathy.  Lungs: Clear to auscultation bilaterally.  Heart: Regular rate and rhythm, no murmurs rubs or gallops.  Abdomen: Bowel sounds are normal, nontender, nondistended, no hepatosplenomegaly or masses,  no abdominal bruits or hernia, no rebound or guarding.   Rectal: *** Extremities: No lower extremity edema. No clubbing or deformities.  Neuro: Alert and oriented x 4 , grossly normal neurologically.  Skin: Warm and dry, no rash or jaundice.    Psych: Alert and cooperative, normal mood and affect.  Labs   *** Imaging Studies   MM 3D DIAGNOSTIC MAMMOGRAM UNILATERAL LEFT BREAST Result Date: 02/28/2023 CLINICAL DATA:  Screening recall for possible left breast mass. EXAM: DIGITAL DIAGNOSTIC UNILATERAL LEFT MAMMOGRAM WITH TOMOSYNTHESIS AND CAD; ULTRASOUND LEFT BREAST LIMITED TECHNIQUE: Left digital diagnostic mammography and breast tomosynthesis was performed. The images were evaluated with computer-aided detection. ; Targeted ultrasound examination of the left breast was performed. COMPARISON:  Screening mammogram dated 02/06/2023. ACR Breast Density Category b: There are scattered areas of fibroglandular density. FINDINGS: Additional tomosynthesis images were performed of the left breast. There is an oval circumscribed mass in the lower outer left breast measuring 0.3 cm. Targeted ultrasound of the left breast was performed. There is an intramammary lymph node in the left breast at 4 o'clock 5 cm from nipple measuring 0.3 x 0.1 x 0.2 cm. This corresponds well with the mass seen in the lower outer left breast at mammography. IMPRESSION: No findings of malignancy in the left breast. RECOMMENDATION: Screening mammogram in one year.(Code:SM-B-01Y) I have discussed the findings and recommendations with the patient. If applicable, a reminder letter will be sent to the patient regarding the next appointment. BI-RADS CATEGORY  2: Benign. Electronically Signed   By: Edwin Cap M.D.   On: 02/28/2023 11:55   [place] LIMITED ULTRASOUND INCLUDING AXILLA LEFT BREAST  Result Date: 02/28/2023 CLINICAL DATA:  Screening recall for possible left breast mass. EXAM: DIGITAL DIAGNOSTIC UNILATERAL LEFT MAMMOGRAM WITH TOMOSYNTHESIS AND CAD; ULTRASOUND LEFT BREAST LIMITED TECHNIQUE: Left digital diagnostic mammography and breast tomosynthesis was performed. The images were evaluated with computer-aided detection. ; Targeted ultrasound examination of the left breast was  performed. COMPARISON:  Screening mammogram dated 02/06/2023. ACR Breast Density Category b: There are scattered areas of fibroglandular density. FINDINGS: Additional tomosynthesis images were performed of the left breast. There is an oval circumscribed mass in the lower outer left breast measuring 0.3 cm. Targeted ultrasound of the left breast  was performed. There is an intramammary lymph node in the left breast at 4 o'clock 5 cm from nipple measuring 0.3 x 0.1 x 0.2 cm. This corresponds well with the mass seen in the lower outer left breast at mammography. IMPRESSION: No findings of malignancy in the left breast. RECOMMENDATION: Screening mammogram in one year.(Code:SM-B-01Y) I have discussed the findings and recommendations with the patient. If applicable, a reminder letter will be sent to the patient regarding the next appointment. BI-RADS CATEGORY  2: Benign. Electronically Signed   By: Edwin Cap M.D.   On: 02/28/2023 11:55   NCV with EMG (electromyography) Result Date: 02/22/2023 Tyrell Antonio, MD     02/27/2023  8:35 AM EMG & NCV Findings: Evaluation of the left median motor nerve showed reduced amplitude (4.1 mV).  The right median motor nerve showed prolonged distal onset latency (4.4 ms) and decreased conduction velocity (Elbow-Wrist, 49 m/s).  The right median (across palm) sensory nerve showed prolonged distal peak latency (Wrist, 4.1 ms).  All remaining nerves (as indicated in the following tables) were within normal limits.  Left vs. Right side comparison data for the median motor nerve indicates abnormal L-R latency difference (0.8 ms).  The ulnar motor nerve indicates abnormal L-R amplitude difference (38.2 %), abnormal L-R velocity difference (B Elbow-Wrist, 21 m/s), and abnormal L-R velocity difference (A Elbow-B Elbow, 18 m/s).  All remaining left vs. right side differences were within normal limits.  All examined muscles (as indicated in the following table) showed no evidence of  electrical instability.  Impression: The above electrodiagnostic study is ABNORMAL and reveals evidence of a moderate right median nerve entrapment at the wrist (carpal tunnel syndrome) affecting sensory and motor components. There is no significant electrodiagnostic evidence of any other focal nerve entrapment, brachial plexopathy or cervical radiculopathy. Recommendations: 1.  Follow-up with referring physician. 2.  Continue current management of symptoms. 3.  Continue use of resting splint at night-time and as needed during the day. 4.  Suggest surgical evaluation. ___________________________ Naaman Plummer FAAPMR Board Certified, American Board of Physical Medicine and Rehabilitation Nerve Conduction Studies Anti Sensory Summary Table  Stim Site NR Peak (ms) Norm Peak (ms) P-T Amp (V) Norm P-T Amp Site1 Site2 Delta-P (ms) Dist (cm) Vel (m/s) Norm Vel (m/s) Left Median Acr Palm Anti Sensory (2nd Digit)  31.7C Wrist    3.5 <3.6 30.1 >10 Wrist Palm 1.7 0.0   Palm    1.8 <2.0 6.8        Right Median Acr Palm Anti Sensory (2nd Digit)  30.1C Wrist    *4.1 <3.6 21.8 >10 Wrist Palm 2.3 0.0   Palm    1.8 <2.0 19.5        Left Radial Anti Sensory (Base 1st Digit)  32C Wrist    1.8 <3.1 38.9  Wrist Base 1st Digit 1.8 0.0   Right Radial Anti Sensory (Base 1st Digit)  30.6C Wrist    2.0 <3.1 35.3  Wrist Base 1st Digit 2.0 0.0   Left Ulnar Anti Sensory (5th Digit)  32.1C Wrist    3.2 <3.7 18.8 >15.0 Wrist 5th Digit 3.2 14.0 44 >38 Right Ulnar Anti Sensory (5th Digit)  30.8C Wrist    2.9 <3.7 23.0 >15.0 Wrist 5th Digit 2.9 14.0 48 >38 Motor Summary Table  Stim Site NR Onset (ms) Norm Onset (ms) O-P Amp (mV) Norm O-P Amp Site1 Site2 Delta-0 (ms) Dist (cm) Vel (m/s) Norm Vel (m/s) Left Median Motor (Abd Poll Brev)  32C Wrist  3.6 <4.2 *4.1 >5 Elbow Wrist 4.1 21.0 51 >50 Elbow    7.7  1.9        Right Median Motor (Abd Poll Brev)  30.8C Wrist    *4.4 <4.2 7.2 >5 Elbow Wrist 4.3 21.0 *49 >50 Elbow    8.7  7.2        Left  Ulnar Motor (Abd Dig Min)  32.2C Wrist    3.0 <4.2 6.3 >3 B Elbow Wrist 2.6 20.0 77 >53 B Elbow    5.6  5.5  A Elbow B Elbow 1.7 10.0 59 >53 A Elbow    7.3  6.4        Right Ulnar Motor (Abd Dig Min)  30.9C Wrist    2.8 <4.2 10.2 >3 B Elbow Wrist 3.6 20.0 56 >53 B Elbow    6.4  3.3  A Elbow B Elbow 1.3 10.0 77 >53 A Elbow    7.7  3.8        EMG  Side Muscle Nerve Root Ins Act Fibs Psw Amp Dur Poly Recrt Int Dennie Bible Comment Right Abd Poll Brev Median C8-T1 Nml Nml Nml Nml Nml 0 Nml Nml  Right 1stDorInt Ulnar C8-T1 Nml Nml Nml Nml Nml 0 Nml Nml  Right PronatorTeres Median C6-7 Nml Nml Nml Nml Nml 0 Nml Nml  Right Biceps Musculocut C5-6 Nml Nml Nml Nml Nml 0 Nml Nml  Right Deltoid Axillary C5-6 Nml Nml Nml Nml Nml 0 Nml Nml  Nerve Conduction Studies Anti Sensory Left/Right Comparison  Stim Site L Lat (ms) R Lat (ms) L-R Lat (ms) L Amp (V) R Amp (V) L-R Amp (%) Site1 Site2 L Vel (m/s) R Vel (m/s) L-R Vel (m/s) Median Acr Palm Anti Sensory (2nd Digit)  31.7C Wrist 3.5 *4.1 0.6 30.1 21.8 27.6 Wrist Palm    Palm 1.8 1.8 0.0 6.8 19.5 65.1      Radial Anti Sensory (Base 1st Digit)  32C Wrist 1.8 2.0 0.2 38.9 35.3 9.3 Wrist Base 1st Digit    Ulnar Anti Sensory (5th Digit)  32.1C Wrist 3.2 2.9 0.3 18.8 23.0 18.3 Wrist 5th Digit 44 48 4 Motor Left/Right Comparison  Stim Site L Lat (ms) R Lat (ms) L-R Lat (ms) L Amp (mV) R Amp (mV) L-R Amp (%) Site1 Site2 L Vel (m/s) R Vel (m/s) L-R Vel (m/s) Median Motor (Abd Poll Brev)  32C Wrist 3.6 *4.4 *0.8 *4.1 7.2 43.1 Elbow Wrist 51 *49 2 Elbow 7.7 8.7 1.0 1.9 7.2 73.6      Ulnar Motor (Abd Dig Min)  32.2C Wrist 3.0 2.8 0.2 6.3 10.2 *38.2 B Elbow Wrist 77 56 *21 B Elbow 5.6 6.4 0.8 5.5 3.3 40.0 A Elbow B Elbow 59 77 *18 A Elbow 7.3 7.7 0.4 6.4 3.8 40.6      Waveforms:             MM 3D SCREENING MAMMOGRAM BILATERAL BREAST Result Date: 02/08/2023 CLINICAL DATA:  Screening. EXAM: DIGITAL SCREENING BILATERAL MAMMOGRAM WITH TOMOSYNTHESIS AND CAD TECHNIQUE: Bilateral screening digital  craniocaudal and mediolateral oblique mammograms were obtained. Bilateral screening digital breast tomosynthesis was performed. The images were evaluated with computer-aided detection. COMPARISON:  None available. ACR Breast Density Category b: There are scattered areas of fibroglandular density. FINDINGS: In the left breast, a possible mass warrants further evaluation. In the right breast, no findings suspicious for malignancy. IMPRESSION: Further evaluation is suggested for a possible mass in the left breast. RECOMMENDATION: Diagnostic mammogram and possibly ultrasound of  the left breast. (Code:[id number]) The patient will be contacted regarding the findings, and additional imaging will be scheduled. BI-RADS CATEGORY  0: Incomplete: Need additional imaging evaluation. Electronically Signed   By: Baird Lyons M.D.   On: 02/08/2023 10:14    Assessment       PLAN   ***   Leanna Battles. Melvyn Neth, MHS, PA-C [facility]

## 2023-03-11 ENCOUNTER — Encounter: Payer: Self-pay | Admitting: Gastroenterology

## 2023-03-25 ENCOUNTER — Telehealth: Payer: Self-pay | Admitting: *Deleted

## 2023-03-25 NOTE — Telephone Encounter (Signed)
 LMOVM to return call    Carron Brazen, RN   Pt wants to reschedule procedure on 3/20, Thank you!

## 2023-03-25 NOTE — Telephone Encounter (Signed)
 Patient called in to cancel procedure for Thursday with Dr. Jena Gauss. She stated she restarted suboxone today and needs to get this back in her system and need to get some things right in her life before she has procedure done. FYI

## 2023-03-25 NOTE — Telephone Encounter (Signed)
 noted

## 2023-03-25 NOTE — Progress Notes (Signed)
 Called pt to review information for procedure scheduled on 03/28/23.  Pt stated she needs to rescheduled as she is taking some different medicine now and wants to make sure she is "safe with anesthesia." Message sent to Dr. Luvenia Starch office.

## 2023-03-28 ENCOUNTER — Ambulatory Visit (HOSPITAL_COMMUNITY): Admit: 2023-03-28 | Payer: MEDICAID | Admitting: Internal Medicine

## 2023-03-28 ENCOUNTER — Encounter (HOSPITAL_COMMUNITY): Payer: Self-pay

## 2023-03-28 SURGERY — COLONOSCOPY WITH PROPOFOL
Anesthesia: Choice

## 2023-04-02 ENCOUNTER — Telehealth: Payer: Self-pay | Admitting: Orthopedic Surgery

## 2023-04-02 NOTE — Telephone Encounter (Signed)
 Returned the pt's call, lvm for her to cb.

## 2023-04-03 ENCOUNTER — Telehealth: Payer: Self-pay | Admitting: Orthopedic Surgery

## 2023-04-03 NOTE — Telephone Encounter (Signed)
 Spoke w/the pt, she stated she has fluid on her lt knee and can't hardly walk.  She is using a cane and is having trouble supporting her weight.  You are booked until the end of next week.  Please advise.

## 2023-04-04 ENCOUNTER — Ambulatory Visit (INDEPENDENT_AMBULATORY_CARE_PROVIDER_SITE_OTHER): Payer: MEDICAID | Admitting: Orthopedic Surgery

## 2023-04-04 ENCOUNTER — Encounter: Payer: Self-pay | Admitting: Orthopedic Surgery

## 2023-04-04 DIAGNOSIS — M1712 Unilateral primary osteoarthritis, left knee: Secondary | ICD-10-CM | POA: Diagnosis not present

## 2023-04-04 DIAGNOSIS — G8929 Other chronic pain: Secondary | ICD-10-CM | POA: Diagnosis not present

## 2023-04-04 DIAGNOSIS — M25562 Pain in left knee: Secondary | ICD-10-CM | POA: Diagnosis not present

## 2023-04-04 DIAGNOSIS — M25462 Effusion, left knee: Secondary | ICD-10-CM

## 2023-04-04 MED ORDER — METHYLPREDNISOLONE ACETATE 40 MG/ML IJ SUSP
40.0000 mg | Freq: Once | INTRAMUSCULAR | Status: AC
  Administered 2023-04-04: 40 mg via INTRA_ARTICULAR

## 2023-04-04 NOTE — Progress Notes (Signed)
 Chief Complaint  Patient presents with   Knee Pain    Left wants aspiration injection     48 year old female came in today unscheduled visit for recurrent pain left knee.  Been following her since May of last year for osteoarthritis of the left knee she has had nonoperative care which included Aloxi cam and multiple injections  Presents this time with acute onset of increased pain and inability to weight-bear and she cannot extend the knee  Her exam is consistent with the following  She is awake and alert She ambulates with a significant limp she cannot straighten the left knee Diffuse tenderness and swelling but localized tenderness over the medial joint line McMurray's positive Ligament stable Skin is intact  Her x-rays show arthritis of the left knee  Encounter Diagnoses  Name Primary?   Effusion, left knee Yes   Primary osteoarthritis of left knee    Chronic pain of left knee     Aspiration injection left knee  Procedure note  Verbal consent obtained left knee confirmed as procedure site Left knee cleaned with alcohol and ethyl chloride Superolateral approach 18-gauge needle aspirated 25 cc of clear yellow fluid injected 40 mg of Depo-Medrol with 3 cc 1% lidocaine no reactions We applied a Band-Aid  Encounter Diagnoses  Name Primary?   Effusion, left knee Yes   Primary osteoarthritis of left knee    Chronic pain of left knee    Recommend MRI left knee Start is on Dosepak Out of work today tomorrow Saturday Sunday return to work Monday Ice 3 times a day  Return in 3 weeks

## 2023-04-04 NOTE — Patient Instructions (Signed)
 While we are working on your approval for MRI please go ahead and call to schedule your appointment with [facility] within at least one (1) week.   Central Scheduling [phone]  Work note out of work until Monday can return on Monday

## 2023-04-15 ENCOUNTER — Telehealth: Payer: Self-pay | Admitting: Orthopedic Surgery

## 2023-04-15 ENCOUNTER — Other Ambulatory Visit: Payer: Self-pay | Admitting: Orthopedic Surgery

## 2023-04-15 DIAGNOSIS — M25462 Effusion, left knee: Secondary | ICD-10-CM

## 2023-04-15 DIAGNOSIS — G8929 Other chronic pain: Secondary | ICD-10-CM

## 2023-04-15 MED ORDER — PREDNISONE 10 MG (48) PO TBPK: ORAL_TABLET | Freq: Every day | ORAL | 0 refills | Status: DC

## 2023-04-15 NOTE — Telephone Encounter (Signed)
 Dr. Mort Sawyers pt - spoke w/the pt, she stated that she was here 3/27 and that Dr. Rexene Edison was sending her for a MRI.  She called the hospital and they are telling her that they don't have anything.  The AVS says:  Return for after MRI or after therapy if MRI denied by Medicaid.  [phone].  She also stated that Dr. Rexene Edison mentioned sending Prednisone in for her, she uses [facility].

## 2023-04-15 NOTE — Telephone Encounter (Signed)
 I missed the MRI order I have put it in  He sent in the prednisone already to the [facility]

## 2023-04-21 ENCOUNTER — Ambulatory Visit (HOSPITAL_COMMUNITY)
Admission: RE | Admit: 2023-04-21 | Discharge: 2023-04-21 | Disposition: A | Discharge status: Home / Self Care | Payer: MEDICAID | Source: Ambulatory Visit | Attending: Orthopedic Surgery | Admitting: Orthopedic Surgery

## 2023-04-21 DIAGNOSIS — G8929 Other chronic pain: Secondary | ICD-10-CM | POA: Diagnosis present

## 2023-04-21 DIAGNOSIS — M25462 Effusion, left knee: Secondary | ICD-10-CM | POA: Diagnosis present

## 2023-04-21 DIAGNOSIS — M25562 Pain in left knee: Secondary | ICD-10-CM | POA: Diagnosis present

## 2023-05-03 ENCOUNTER — Ambulatory Visit (INDEPENDENT_AMBULATORY_CARE_PROVIDER_SITE_OTHER): Payer: MEDICAID | Admitting: Orthopedic Surgery

## 2023-05-03 DIAGNOSIS — M1712 Unilateral primary osteoarthritis, left knee: Secondary | ICD-10-CM

## 2023-05-03 DIAGNOSIS — M23322 Other meniscus derangements, posterior horn of medial meniscus, left knee: Secondary | ICD-10-CM

## 2023-05-03 MED ORDER — HYDROCODONE-ACETAMINOPHEN 5-325 MG PO TABS: 1.0000 | ORAL_TABLET | Freq: Four times a day (QID) | ORAL | 0 refills | Status: DC | PRN

## 2023-05-03 NOTE — Progress Notes (Signed)
 Follow-up  Status MRI right knee  Left knee pain knee pain, rule out meniscus tear  Encounter Diagnoses  Name Primary?   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 Intrasubstance meniscal degenerative changes but no discrete meniscal tear. 2. Intact ligamentous structures and no acute bony findings. 3. Age advanced tricompartmental degenerative changes most significant in the medial compartment. 4. Moderate-sized joint effusion. 5. Pes anserine bursitis.    CARTILAGE   Patellofemoral: Moderate age advanced degenerative chondrosis with areas of cartilage thinning and fissuring. No full-thickness cartilage defect.   Medial: Age advanced degenerative chondrosis with marked cartilage thinning and possible full or near full-thickness cartilage loss along the medial femoral condyle.   Lateral: Mild/moderate degenerative chondrosis with early spurring changes. Electronically Signed   By: Marrian Siva M.D.   On: 05/01/2023 16:19  I presented the findings to the patient and discussed with her treatment options which include  Continue nonoperative care  Arthroscopic surgery  Total knee arthroplasty  After discussing all treatment options with the patient and basically recommending against arthroplasty  She opted for pain medication for 30 days if no improvement pain management  She denies still being on Suboxone although the databank suggest she got a prescription April 4  Meds ordered this encounter  Medications   HYDROcodone -acetaminophen   (NORCO/VICODIN) 5-325 MG tablet    Sig: Take 1 tablet by mouth every 6 (six) hours as needed for moderate pain (pain score 4-6).    Dispense:  30 tablet    Refill:  0    If she did of course this will be denied

## 2023-05-31 ENCOUNTER — Ambulatory Visit: Payer: MEDICAID | Admitting: Orthopedic Surgery

## 2023-05-31 ENCOUNTER — Encounter: Payer: Self-pay | Admitting: Orthopedic Surgery

## 2023-05-31 ENCOUNTER — Telehealth: Payer: Self-pay | Admitting: Radiology

## 2023-05-31 ENCOUNTER — Other Ambulatory Visit: Payer: Self-pay | Admitting: Orthopedic Surgery

## 2023-05-31 DIAGNOSIS — M23322 Other meniscus derangements, posterior horn of medial meniscus, left knee: Secondary | ICD-10-CM

## 2023-05-31 DIAGNOSIS — M1712 Unilateral primary osteoarthritis, left knee: Secondary | ICD-10-CM

## 2023-05-31 DIAGNOSIS — Z01818 Encounter for other preprocedural examination: Secondary | ICD-10-CM

## 2023-05-31 NOTE — Progress Notes (Signed)
 Chief Complaint  Patient presents with   Follow-up    Left knee pain- pt does not want to go to pain clinic-    Shannon Reese has arthritis, derangement of the posterior horn of the medial meniscus frequent effusions in her left knee.  On her last visit she did not want to have surgery and we recommended chronic pain management  However, the patient had issues with chronic pain medication and opioids before has recently stopped Suboxone and does not want to take opioids anymore  So we discussed her situation again today  Her main complaints are  She has pain in the knee which is relieved when the knee pops She has pain in the back of the knee and then medially and laterally  She is 49 years old and with her arthritis it would be difficult to manage this and we discussed that.  She is basically too young for a total knee and her pain has become disabling for her  She stands all day at work she does a lot of walking and she says "I need my knees"  She understands that we may not be able to relieve her symptoms.  She is willing to take the risk.  Plan is for arthroscopy left knee partial medial meniscectomy.  We will not do aggressive chondroplasties in this case

## 2023-05-31 NOTE — Telephone Encounter (Signed)
-----   Message from Elsa Halls sent at 05/31/2023 11:26 AM EDT ----- Shannon Reese [name]

## 2023-05-31 NOTE — Progress Notes (Signed)
 Orders placed SALK mm

## 2023-05-31 NOTE — Telephone Encounter (Signed)
 Put in orders Does she need walker / crutches or does she already have?

## 2023-06-05 ENCOUNTER — Telehealth: Payer: Self-pay | Admitting: Radiology

## 2023-06-05 NOTE — Telephone Encounter (Signed)
 June 17th

## 2023-06-05 NOTE — Telephone Encounter (Signed)
-----   Message from Elsa Halls sent at 05/31/2023 11:26 AM EDT ----- Shannon Reese [name]

## 2023-06-05 NOTE — Telephone Encounter (Signed)
 I called her advised 17th and she will need to go for pre op they will tell her arrival time  Can you please RS her post op appointment for 1 week post op?

## 2023-06-20 NOTE — Patient Instructions (Signed)
 Shannon Reese  06/20/2023     @PREFPERIOPPHARMACY @   Your procedure is scheduled on  06/25/2023.   Report to [facility] at  0700  A.M.    Call this number if you have problems the morning of surgery:  [phone]  If you experience any cold or flu symptoms such as cough, fever, chills, shortness of breath, etc. between now and your scheduled surgery, please notify us  at the above number.   Remember:         Use your inhaler before you come and bring your rescue inhaler with you.    Do not eat after midnight.   You may drink clear liquids until 0445 am on 06/25/2023.    Clear liquids allowed are:                    Water, Juice (No red color; non-citric and without pulp; diabetics please choose diet or no sugar options), Carbonated beverages (diabetics please choose diet or no sugar options), Clear Tea (No creamer, milk, or cream, including half & half and powdered creamer), Black Coffee Only (No creamer, milk or cream, including half & half and powdered creamer), and Clear Sports drink (No red color; diabetics please choose diet or no sugar options)    Take these medicines the morning of surgery with A SIP OF WATER          escitalopram , gabapentin, hydrocodone (if needed), invega, levothyroxine, omeprazole .    Do not wear jewelry, make-up or nail polish, including gel polish,  artificial nails, or any other type of covering on natural nails (fingers and  toes).  Do not wear lotions, powders, or perfumes, or deodorant.  Do not shave 48 hours prior to surgery.  Men may shave face and neck.  Do not bring valuables to the hospital.  [facility] is not responsible for any belongings or valuables.  Contacts, dentures or bridgework may not be worn into surgery.  Leave your suitcase in the car.  After surgery it may be brought to your room.  For patients admitted to the hospital, discharge time will be determined by your treatment team.  Patients discharged the day  of surgery will not be allowed to drive home and must have someone with them for 24 hours.    Special instructions:   DO NOT smoke tobacco or vape for 24 hours before your procedure.  Please read over the following fact sheets that you were given. Coughing and Deep Breathing, Surgical Site Infection Prevention, Anesthesia Post-op Instructions, and Care and Recovery After Surgery      Arthroscopic Knee Ligament Repair, Care After After your knee surgery, it's common to have soreness, swelling, or pain. You may also have some fluid coming from the cuts that were made on your knee (incisions). Follow these instructions at home: Medicines Take over-the-counter and prescription medicines only as told by your health care provider. Ask your provider if the medicine prescribed to you: Requires you to avoid driving or using machinery. Can cause constipation. You may need to take these actions to prevent or treat constipation: Drink enough fluid to keep your pee (urine) pale yellow. Take over-the-counter or prescription medicines. Eat foods that are high in fiber, such as beans, whole grains, and fresh fruits and vegetables. Limit foods that are high in fat and processed sugars, such as fried or sweet foods. If you have a brace: Wear the brace as told by your provider. Remove  it only as told by your provider. Check the skin around the brace every day. Tell your provider about any concerns. Loosen the brace if your toes tingle, become numb, or turn cold and blue. Keep it clean and dry. Ask your provider when it's safe to drive if you have a brace on your knee. Bathing Do not take baths, swim, or use a hot tub until your provider approves. Keep your bandage dry until your provider says that it can be removed. If the brace is not waterproof: Do not let it get wet. Cover it with a watertight covering when you take a bath or shower. Incision care  Follow instructions from your provider about how  to take care of your incisions. Make sure you: Wash your hands with soap and water for at least 20 seconds before and after you change your dressing. If soap and water are not available, use hand sanitizer. Change your dressing as told by your provider. Leave stitches, skin glue, or tape strips in place. These skin closures may need to stay in place for 2 weeks or longer. If tape strip edges start to loosen and curl up, you may trim the loose edges. Do not remove tape strips completely unless your provider tells you to do that. Check your incision areas every day for signs of infection. Check for: Redness. More swelling or pain. Blood or more fluid. Warmth. Pus or a bad smell. Managing pain, stiffness, and swelling  If told, put ice on the affected area. If you have a removable brace, remove it as told by your provider. Put ice in a plastic bag. Place a towel between your skin and the bag. Leave the ice on for 20 minutes, 2-3 times a day. If your skin turns bright red, remove the ice right away to prevent skin damage. The risk of damage is higher if you can't feel pain, heat, or cold. Move your toes often to reduce stiffness and swelling. Raise the injured area above the level of your heart while you are sitting or lying down. Activity Do not use your knee to walk until your provider says that you can. Use crutches or other devices as told by your provider. You will work with a physical therapist to do exercises. This will make your knee move better and get stronger. Your provider will tell you: When you may do exercises that move parts of your body. These are called motion exercises. When you may start to ride a stationary bike and other gentle exercises. When you may start to do harder exercises, such as jogging. You may have to avoid lifting. Ask your provider how much you can safely lift. Return to your normal activities as told by your provider. Ask your provider what activities are  safe for you. General instructions Do not use any products that contain nicotine or tobacco. These products include cigarettes, chewing tobacco, and vaping devices, such as e-cigarettes. If you need help quitting, ask your provider. Wear compression stockings as told by your provider. These stockings help to prevent blood clots and reduce swelling in your leg. Keep all follow-up visits. Your provider will check that your knee is healing well. Contact a health care provider if: You have signs of infection in the cuts that were made on your knee. Signs include swelling, redness, warmth, pus, or a bad smell. You have a fever or chills. You have pain that does not get better with medicine. The cuts in your knee open up. Get  help right away if: You have trouble breathing. You have chest pain. You have worse pain or swelling in your calf or at the back of your knee. Your knee, foot, ankle, or toes tingle or become numb. Your foot or toes look darker than normal or are cooler than normal. These symptoms may be an emergency. Get help right away. Call 911. Do not wait to see if the symptoms will go away. Do not drive yourself to the hospital. This information is not intended to replace advice given to you by your health care provider. Make sure you discuss any questions you have with your health care provider. Document Revised: 03/21/2022 Document Reviewed: 03/21/2022 Elsevier Patient Education  2024 [employer]General Anesthesia, Adult, Care After The following information offers guidance on how to care for yourself after your procedure. Your health care provider may also give you more specific instructions. If you have problems or questions, contact your health care provider. What can I expect after the procedure? After the procedure, it is common for people to: Have pain or discomfort at the IV site. Have nausea or vomiting. Have a sore throat or hoarseness. Have trouble concentrating. Feel  cold or chills. Feel weak, sleepy, or tired (fatigue). Have soreness and body aches. These can affect parts of the body that were not involved in surgery. Follow these instructions at home: For the time period you were told by your health care provider:  Rest. Do not participate in activities where you could fall or become injured. Do not drive or use machinery. Do not drink alcohol. Do not take sleeping pills or medicines that cause drowsiness. Do not make important decisions or sign legal documents. Do not take care of children on your own. General instructions Drink enough fluid to keep your urine pale yellow. If you have sleep apnea, surgery and certain medicines can increase your risk for breathing problems. Follow instructions from your health care provider about wearing your sleep device: Anytime you are sleeping, including during daytime naps. While taking prescription pain medicines, sleeping medicines, or medicines that make you drowsy. Return to your normal activities as told by your health care provider. Ask your health care provider what activities are safe for you. Take over-the-counter and prescription medicines only as told by your health care provider. Do not use any products that contain nicotine or tobacco. These products include cigarettes, chewing tobacco, and vaping devices, such as e-cigarettes. These can delay incision healing after surgery. If you need help quitting, ask your health care provider. Contact a health care provider if: You have nausea or vomiting that does not get better with medicine. You vomit every time you eat or drink. You have pain that does not get better with medicine. You cannot urinate or have bloody urine. You develop a skin rash. You have a fever. Get help right away if: You have trouble breathing. You have chest pain. You vomit blood. These symptoms may be an emergency. Get help right away. Call 911. Do not wait to see if the  symptoms will go away. Do not drive yourself to the hospital. Summary After the procedure, it is common to have a sore throat, hoarseness, nausea, vomiting, or to feel weak, sleepy, or fatigue. For the time period you were told by your health care provider, do not drive or use machinery. Get help right away if you have difficulty breathing, have chest pain, or vomit blood. These symptoms may be an emergency. This information is not intended to replace  advice given to you by your health care provider. Make sure you discuss any questions you have with your health care provider. Document Revised: 03/24/2021 Document Reviewed: 03/24/2021 Elsevier Patient Education  2024 [employer].How to Use Chlorhexidine at Home in the Shower Chlorhexidine gluconate (CHG) is a germ-killing (antiseptic) wash that's used to clean the skin. It can get rid of the germs that normally live on the skin and can keep them away for about 24 hours. If you're having surgery, you may be told to shower with CHG at home the night before surgery. This can help lower your risk for infection. To use CHG wash in the shower, follow the steps below. Supplies needed: CHG body wash. Clean washcloth. Clean towel. How to use CHG in the shower Follow these steps unless you're told to use CHG in a different way: Start the shower. Use your normal soap and shampoo to wash your face and hair. Turn off the shower or move out of the shower stream. Pour CHG onto a clean washcloth. Do not use any type of brush or rough sponge. Start at your neck, washing your body down to your toes. Make sure you: Wash the part of your body where the surgery will be done for at least 1 minute. Do not scrub. Do not use CHG on your head or face unless your health care provider tells you to. If it gets into your ears or eyes, rinse them well with water. Do not wash your genitals with CHG. Wash your back and under your arms. Make sure to wash skin folds. Let  the CHG sit on your skin for 1-2 minutes or as long as told. Rinse your entire body in the shower, including all body creases and folds. Turn off the shower. Dry off with a clean towel. Do not put anything on your skin afterward, such as powder, lotion, or perfume. Put on clean clothes or pajamas. If it's the night before surgery, sleep in clean sheets. General tips Use CHG only as told, and follow the instructions on the label. Use the full amount of CHG as told. This is often one bottle. Do not smoke and stay away from flames after using CHG. Your skin may feel sticky after using CHG. This is normal. The sticky feeling will go away as the CHG dries. Do not use CHG: If you have a chlorhexidine allergy or have reacted to chlorhexidine in the past. On open wounds or areas of skin that have broken skin, cuts, or scrapes. On babies younger than 69 months of age. Contact a health care provider if: You have questions about using CHG. Your skin gets irritated or itchy. You have a rash after using CHG. You swallow any CHG. Call your local poison control center [phone] in the [place] Your eyes itch badly, or they become very red or swollen. Your hearing changes. You have trouble seeing. If you can't reach your provider, go to an urgent care or emergency room. Do not drive yourself. Get help right away if: You have swelling or tingling in your mouth or throat. You make high-pitched whistling sounds when you breathe, most often when you breathe out (wheeze). You have trouble breathing. These symptoms may be an emergency. Call 911 right away. Do not wait to see if the symptoms will go away. Do not drive yourself to the hospital. This information is not intended to replace advice given to you by your health care provider. Make sure you discuss any questions you have  with your health care provider. Document Revised: 07/10/2022 Document Reviewed: 07/06/2021 Elsevier Patient Education  2024  [employer]How to Use an Incentive Spirometer An incentive spirometer is a tool that measures how well you are filling your lungs with each breath. Learning to take long, deep breaths using this tool can help you keep your lungs clear and active. This may help to reverse or lessen your chance of developing breathing (pulmonary) problems, especially infection. You may be asked to use a spirometer: After a surgery. If you have a lung problem or a history of smoking. After a long period of time when you have been unable to move or be active. If the spirometer includes an indicator to show the highest number that you have reached, your health care provider or respiratory therapist will help you set a goal. Keep a log of your progress as told by your health care provider. What are the risks? Breathing too quickly may cause dizziness or cause you to pass out. Take your time so you do not get dizzy or light-headed. If you are in pain, you may need to take pain medicine before doing incentive spirometry. It is harder to take a deep breath if you are having pain. How to use your incentive spirometer  Sit up on the edge of your bed or on a chair. Hold the incentive spirometer so that it is in an upright position. Before you use the spirometer, breathe out normally. Place the mouthpiece in your mouth. Make sure your lips are closed tightly around it. Breathe in slowly and as deeply as you can through your mouth, causing the piston or the ball to rise toward the top of the chamber. Hold your breath for 3-5 seconds, or for as long as possible. If the spirometer includes a coach indicator, use this to guide you in breathing. Slow down your breathing if the indicator goes above the marked areas. Remove the mouthpiece from your mouth and breathe out normally. The piston or ball will return to the bottom of the chamber. Rest for a few seconds, then repeat the steps 10 or more times. Take your time and take a  few normal breaths between deep breaths so that you do not get dizzy or light-headed. Do this every 1-2 hours when you are awake. If the spirometer includes a goal marker to show the highest number you have reached (best effort), use this as a goal to work toward during each repetition. After each set of 10 deep breaths, cough a few times. This will help to make sure that your lungs are clear. If you have an incision on your chest or abdomen from surgery, place a pillow or a rolled-up towel firmly against the incision when you cough. This can help to reduce pain while taking deep breaths and coughing. General tips When you are able to get out of bed: Walk around often. Continue to take deep breaths and cough in order to clear your lungs. Keep using the incentive spirometer until your health care provider says it is okay to stop using it. If you have been in the hospital, you may be told to keep using the spirometer at home. Contact a health care provider if: You are having difficulty using the spirometer. You have trouble using the spirometer as often as instructed. Your pain medicine is not giving enough relief for you to use the spirometer as told. You have a fever. Get help right away if: You develop shortness of breath. You  develop a cough with bloody mucus from the lungs. You have fluid or blood coming from an incision site after you cough. Summary An incentive spirometer is a tool that can help you learn to take long, deep breaths to keep your lungs clear and active. You may be asked to use a spirometer after a surgery, if you have a lung problem or a history of smoking, or if you have been inactive for a long period of time. Use your incentive spirometer as instructed every 1-2 hours while you are awake. If you have an incision on your chest or abdomen, place a pillow or a rolled-up towel firmly against your incision when you cough. This will help to reduce pain. Get help right away if  you have shortness of breath, you cough up bloody mucus, or blood comes from your incision when you cough. This information is not intended to replace advice given to you by your health care provider. Make sure you discuss any questions you have with your health care provider. Document Revised: 11/02/2022 Document Reviewed: 11/02/2022 Elsevier Patient Education  2024 [employer].

## 2023-06-21 ENCOUNTER — Encounter: Payer: MEDICAID | Admitting: Orthopedic Surgery

## 2023-06-21 ENCOUNTER — Encounter (HOSPITAL_COMMUNITY): Payer: Self-pay

## 2023-06-21 ENCOUNTER — Inpatient Hospital Stay (HOSPITAL_COMMUNITY)
Admission: RE | Admit: 2023-06-21 | Discharge: 2023-06-21 | Disposition: A | Discharge status: Home / Self Care | Payer: MEDICAID | Source: Ambulatory Visit

## 2023-06-21 NOTE — Progress Notes (Signed)
 Patient called to cancel procedure for Knee Arthroscopy on 06/25/2023.  She is unable to be out of work for that length of time right now and will reschedule at a later date.

## 2023-06-24 ENCOUNTER — Telehealth: Payer: Self-pay | Admitting: Radiology

## 2023-06-24 ENCOUNTER — Telehealth: Payer: Self-pay | Admitting: Orthopedic Surgery

## 2023-06-24 NOTE — Telephone Encounter (Signed)
 Dr. Delfino Fellers pt - spoke w/the pt, she wants to cx her surgery for tomorrow and reschedule for about a month out, due to financial reasons.  [phone]

## 2023-06-24 NOTE — Telephone Encounter (Signed)
-----   Message from Nurse Aundria Leech sent at 06/21/2023  2:54 PM EDT ----- Regarding: Cancellation Patient called to cancel procedure with reason being that she can't be out of work that long for recovery. Orelia Binet, can you remove her from the schedule for now?

## 2023-06-25 ENCOUNTER — Ambulatory Visit (HOSPITAL_COMMUNITY): Admission: RE | Admit: 2023-06-25 | Payer: MEDICAID | Source: Home / Self Care | Admitting: Orthopedic Surgery

## 2023-06-25 ENCOUNTER — Encounter (HOSPITAL_COMMUNITY): Admission: RE | Payer: Self-pay | Source: Home / Self Care

## 2023-06-25 SURGERY — ARTHROSCOPY, KNEE, WITH MEDIAL MENISCECTOMY
Anesthesia: General | Site: Knee | Laterality: Left

## 2023-07-03 ENCOUNTER — Encounter: Payer: MEDICAID | Admitting: Orthopedic Surgery

## 2023-07-11 ENCOUNTER — Ambulatory Visit: Payer: MEDICAID | Admitting: Orthopedic Surgery

## 2023-07-11 DIAGNOSIS — M170 Bilateral primary osteoarthritis of knee: Secondary | ICD-10-CM | POA: Diagnosis not present

## 2023-07-11 DIAGNOSIS — M1712 Unilateral primary osteoarthritis, left knee: Secondary | ICD-10-CM

## 2023-07-11 DIAGNOSIS — M1711 Unilateral primary osteoarthritis, right knee: Secondary | ICD-10-CM

## 2023-07-11 MED ORDER — PREDNISONE 10 MG (48) PO TBPK: ORAL_TABLET | Freq: Every day | ORAL | 0 refills | Status: DC

## 2023-07-11 NOTE — Addendum Note (Signed)
 Addended by: MARGRETTE TAFT BRAVO on: 07/11/2023 05:05 PM   Modules accepted: Orders

## 2023-07-11 NOTE — Progress Notes (Addendum)
 Chief Complaint  Patient presents with   Injections    Bilat knees   Encounter Diagnoses  Name Primary?   Primary osteoarthritis of left knee Yes   Primary osteoarthritis of right knee    Procedure note for bilateral knee injections  Procedure note left knee injection verbal consent was obtained to inject left knee joint  Timeout was completed to confirm the site of injection  The medications used were 40 mg depomedrol and 3 cc of 1% lidocaine   Anesthesia was provided by ethyl chloride and the skin was prepped with alcohol.  After cleaning the skin with alcohol a 20-gauge needle was used to inject the left knee joint. There were no complications. A sterile bandage was applied.   Procedure note right knee injection verbal consent was obtained to inject right knee joint  Timeout was completed to confirm the site of injection  The medications used were 40 mg depomedrol and 3 cc of 1% lidocaine   Anesthesia was provided by ethyl chloride and the skin was prepped with alcohol.  After cleaning the skin with alcohol a 20-gauge needle was used to inject the right knee joint. There were no complications. A sterile bandage was applied.  Meds ordered this encounter  Medications   predniSONE  (STERAPRED UNI-PAK 48 TAB) 10 MG (48) TBPK tablet    Sig: Take by mouth daily. 10 mg 12 days ds as directed    Dispense:  48 tablet    Refill:  0

## 2023-07-22 ENCOUNTER — Other Ambulatory Visit: Payer: Self-pay

## 2023-07-22 DIAGNOSIS — I872 Venous insufficiency (chronic) (peripheral): Secondary | ICD-10-CM

## 2023-07-30 ENCOUNTER — Ambulatory Visit (HOSPITAL_COMMUNITY)
Admission: RE | Admit: 2023-07-30 | Discharge: 2023-07-30 | Disposition: A | Discharge status: Home / Self Care | Payer: MEDICAID | Source: Ambulatory Visit | Attending: Vascular Surgery | Admitting: Vascular Surgery

## 2023-07-30 DIAGNOSIS — I872 Venous insufficiency (chronic) (peripheral): Secondary | ICD-10-CM | POA: Insufficient documentation

## 2023-08-18 ENCOUNTER — Emergency Department (HOSPITAL_COMMUNITY): Payer: MEDICAID

## 2023-08-18 ENCOUNTER — Other Ambulatory Visit: Payer: Self-pay

## 2023-08-18 ENCOUNTER — Encounter (HOSPITAL_COMMUNITY): Payer: Self-pay | Admitting: Emergency Medicine

## 2023-08-18 ENCOUNTER — Emergency Department (HOSPITAL_COMMUNITY)
Admission: EM | Admit: 2023-08-18 | Discharge: 2023-08-18 | Disposition: A | Discharge status: Home / Self Care | Payer: MEDICAID | Source: Ambulatory Visit | Attending: Emergency Medicine | Admitting: Emergency Medicine

## 2023-08-18 DIAGNOSIS — M25521 Pain in right elbow: Secondary | ICD-10-CM | POA: Insufficient documentation

## 2023-08-18 DIAGNOSIS — M25562 Pain in left knee: Secondary | ICD-10-CM | POA: Diagnosis not present

## 2023-08-18 DIAGNOSIS — S5001XA Contusion of right elbow, initial encounter: Secondary | ICD-10-CM

## 2023-08-18 MED ORDER — PREDNISONE 10 MG PO TABS
20.0000 mg | ORAL_TABLET | Freq: Two times a day (BID) | ORAL | 0 refills | Status: AC
Start: 2023-08-18 — End: ?

## 2023-08-18 MED ORDER — PREDNISONE 20 MG PO TABS
40.0000 mg | ORAL_TABLET | Freq: Once | ORAL | Status: AC
  Administered 2023-08-18: 40 mg via ORAL
  Filled 2023-08-18: qty 2

## 2023-08-18 MED ORDER — HYDROCODONE-ACETAMINOPHEN 5-325 MG PO TABS
2.0000 | ORAL_TABLET | Freq: Once | ORAL | Status: AC
  Administered 2023-08-18: 2 via ORAL
  Filled 2023-08-18: qty 2

## 2023-08-18 MED ORDER — HYDROCODONE-ACETAMINOPHEN 5-325 MG PO TABS: 1.0000 | ORAL_TABLET | Freq: Four times a day (QID) | ORAL | 0 refills | Status: AC | PRN

## 2023-08-18 NOTE — ED Triage Notes (Signed)
 Pt c/o right elbow pain x 3 weeks and recurrent left knee pain x 2 days. States that she has fluid drained off occasionally.

## 2023-08-18 NOTE — ED Provider Notes (Signed)
 [place] EMERGENCY DEPARTMENT AT [facility] Provider Note   CSN: [id number] Arrival date & time: 08/18/23  9740     Patient presents with: Elbow Pain and Knee Pain   Shannon Reese is a 48 y.o. female.   Patient is a 48 year old female presenting with complaints of right elbow and left knee pain.  She reports banging her elbow on a door 1 week ago and it has been hurting since.  She also describes pain and swelling to her left knee.  She has a history of osteoarthritis and has had her knee drained and injected on several occasions.  No fevers or chills.  Pain worse with ambulation with no alleviating factors.       Prior to Admission medications   Medication Sig Start Date End Date Taking? Authorizing Provider  ADDERALL XR 30 MG 24 hr capsule Take 30 mg by mouth every morning. 08/27/19   [provider]  ALPRAZolam  (XANAX ) 1 MG tablet Take 1 mg by mouth 3 (three) times daily. Patient not taking: Reported on 05/03/2023 02/12/23   [provider]  escitalopram  (LEXAPRO ) 20 MG tablet Take by mouth. 02/12/23   [provider]  furosemide  (LASIX ) 40 MG tablet TAKE 0.5 TABLETS EVERY DAY BY ORAL ROUTE AS NEEDED FOR 30 DAYS.    [provider]  gabapentin (NEURONTIN) 400 MG capsule Take 400 mg by mouth 3 (three) times daily. 08/26/19   [provider]  HYDROcodone -acetaminophen  (NORCO/VICODIN) 5-325 MG tablet Take 1 tablet by mouth every 6 (six) hours as needed for moderate pain (pain score 4-6). 05/03/23   Margrette Taft BRAVO, MD  ibuprofen  (ADVIL ) 200 MG tablet Take 800 mg by mouth every 8 (eight) hours as needed.    [provider]  INVEGA 3 MG 24 hr tablet TAKE 1 TABLET (3 MG) BY MOUTH DAILY IN THE MORNING    [provider]  levothyroxine (SYNTHROID) 25 MCG tablet Take 25 mcg by mouth daily. 02/20/23   [provider]  omeprazole  (PRILOSEC) 40 MG capsule Take 40 mg by mouth daily. 02/20/23   [provider]  predniSONE  (STERAPRED UNI-PAK 48 TAB) 10 MG (48) TBPK tablet Take by mouth daily. 10 mg 12 days ds as directed 07/11/23   Margrette Taft BRAVO, MD  PROAIR  HFA 108 [phone] Base) MCG/ACT inhaler INHALE 2 PUFFS INTO THE LUNGS EVERY 6 HOURS AS NEEDED FOR WHEEZING ORSHORTNESS OF BREATH. 02/01/15   Bari Theodoro FALCON, MD    Allergies: Penicillins, Bee venom, Cephalexin, Lansoprazole, and Tramadol     Review of Systems  All other systems reviewed and are negative.   Updated Vital Signs BP 122/75   Pulse 78   Temp 98.4 F (36.9 C)   Resp 18   Ht 5' 10 (1.778 m)   Wt 124.4 kg   LMP 02/22/2013   SpO2 98%   BMI 39.35 kg/m   Physical Exam Vitals and nursing note reviewed.  Constitutional:      Appearance: Normal appearance.  Pulmonary:     Effort: Pulmonary effort is normal.  Musculoskeletal:     Comments: The right elbow is grossly normal in appearance.  There is no deformity or significant swelling.  Ulnar and radial pulses are palpable and motor and sensation are intact throughout the entire hand.  The left knee is grossly normal in appearance.  There is no obvious effusion.  She does have some pain with range of motion, but no crepitus and no instability with varus or  valgus stress.  Neurological:     Mental Status: She is alert and oriented to person, place, and time.     (all labs ordered are listed, but only abnormal results are displayed) Labs Reviewed - No data to display  EKG: None  Radiology: DG Knee Complete 4 Views Left Result Date: 08/18/2023 EXAM: 4 or more VIEW(S) XRAY OF THE LEFT KNEE 08/18/2023 03:56:49 AM COMPARISON: Left knee radiographs from 08/26/2022. CLINICAL HISTORY: Knee pain. Per triage right elbow pain x 3 weeks and recurrent left knee pain x 2 days. States that she has fluid drained off occasionally. FINDINGS: BONES AND JOINTS: No acute fracture. No focal osseous lesion. No joint dislocation. No significant joint effusion. Mild degenerative changes. SOFT  TISSUES: The soft tissues are unremarkable. IMPRESSION: 1. Stable mild degenerative changes in the left knee. Electronically signed by: Lonni Necessary MD 08/18/2023 04:08 AM EDT RP Workstation: [id number]   DG Elbow Complete Right Result Date: 08/18/2023 EXAM: 3 VIEW(S) XRAY OF THE RIGHT ELBOW COMPARISON: None available. CLINICAL HISTORY: Right elbow pain x 3 weeks and recurrent left knee pain x 2 days. States that she has fluid drained off occasionally. FINDINGS: BONES AND JOINTS: No acute fracture. No focal osseous lesion. No joint dislocation. SOFT TISSUES: The soft tissues are unremarkable. IMPRESSION: 1. No acute abnormality. Electronically signed by: Lonni Necessary MD 08/18/2023 04:05 AM EDT RP Workstation: [id number]     Procedures   Medications Ordered in the ED  HYDROcodone -acetaminophen  (NORCO/VICODIN) 5-325 MG per tablet 2 tablet (has no administration in time range)  predniSONE  (DELTASONE ) tablet 40 mg (has no administration in time range)                                    Medical Decision Making Amount and/or Complexity of Data Reviewed Radiology: ordered.  Risk Prescription drug management.   Patient to be treated with prednisone  and pain medication for a flareup of knee pain.  To follow-up as needed.     Final diagnoses:  None    ED Discharge Orders     None          Geroldine Berg, MD 08/18/23 [phone]

## 2023-08-18 NOTE — ED Notes (Signed)
 Xray at bedside. Kellogg RN

## 2023-08-18 NOTE — Discharge Instructions (Signed)
 Begin taking prednisone  as prescribed.  Begin taking hydrocodone  as prescribed as needed for pain.  Follow-up with your orthopedist in the next week if not improving.

## 2023-09-10 ENCOUNTER — Ambulatory Visit: Payer: MEDICAID

## 2023-09-12 ENCOUNTER — Ambulatory Visit: Payer: MEDICAID | Attending: Vascular Surgery | Admitting: Physician Assistant

## 2023-09-12 VITALS — BP 114/79 | HR 89 | Temp 97.7°F | Wt 273.5 lb

## 2023-09-12 DIAGNOSIS — I872 Venous insufficiency (chronic) (peripheral): Secondary | ICD-10-CM | POA: Insufficient documentation

## 2023-09-12 NOTE — Progress Notes (Signed)
 [facility]           [facility] [place]  History and Physical   Shannon Reese is a 48 y.o. female who presents with bilateral leg swelling with left worse than right.    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  She has swelling in the left > right leg.  She is on diuretic for this, which does help.  She has tried to wear compression in the past but she is unable to get them on.  She does not have hx of DVT.  She does have family hx of leg swelling.  She does have some superficial spider veins and she states the one on the left leg has bled once.   She states that she is in menopause and weighs more that she ever has and it is very hard to loose weight.  She does smoke.  She states she needs bilateral knee replacements but they will not do it until she is 50.  She get injections in both knees.    She has hx of bipolar disorder and she is on medication that is making her sleepy and the dose is being adjusted.   The pt is not on a statin for cholesterol management.  The pt is not on a daily aspirin.   Other AC:  none The pt is not on medication for hypertension.   The pt is not on medication for diabetes.   Tobacco hx:  current  Pt does not have family hx of AAA.  Past Medical History:  Diagnosis Date   Anxiety    Arthritis    B12 deficiency    Bipolar disorder (HCC)    Chronic pain    COPD (chronic obstructive pulmonary disease) (HCC)    Depression    Facet joint disease MRI 2012   GERD (gastroesophageal reflux disease)    Low back pain    Narcotic abuse (HCC)    Drug seeking behavior   Thyroid disease     Past Surgical History:  Procedure Laterality Date   CRYOABLATION     DENTAL SURGERY     TUBAL LIGATION     VAGINAL HYSTERECTOMY N/A 03/04/2013   Procedure: HYSTERECTOMY VAGINAL;  Surgeon: Vonn VEAR Inch, MD;  Location: [facility];  Service: Gynecology;  Laterality: N/A;    Social History   Socioeconomic History   Marital status: Single    Spouse  name: Not on file   Number of children: Not on file   Years of education: Not on file   Highest education level: Not on file  Occupational History   Not on file  Tobacco Use   Smoking status: Every Day    Current packs/day: 1.50    Average packs/day: 1.5 packs/day for 21.0 years (31.5 ttl pk-yrs)    Types: Cigarettes   Smokeless tobacco: Never  Vaping Use   Vaping status: Every Day  Substance and Sexual Activity   Alcohol use: No    Comment: never heavy   Drug use: Yes    Types: Marijuana    Comment: cocaine. none in 20 years.   Sexual activity: Yes    Birth control/protection: Surgical    Comment: hyst  Other Topics Concern   Not on file  Social History Narrative   Not on file   Social Drivers of Health   Financial Resource Strain: Low Risk  (09/04/2019)   Overall Financial Resource Strain (CARDIA)  Difficulty of Paying Living Expenses: Not very hard  Food Insecurity: No Food Insecurity (09/04/2019)   Hunger Vital Sign    Worried About Running Out of Food in the Last Year: Never true    Ran Out of Food in the Last Year: Never true  Transportation Needs: No Transportation Needs (09/04/2019)   PRAPARE - Administrator, Civil Service (Medical): No    Lack of Transportation (Non-Medical): No  Physical Activity: Sufficiently Active (09/04/2019)   Exercise Vital Sign    Days of Exercise per Week: 3 days    Minutes of Exercise per Session: 60 min  Stress: Stress Concern Present (09/04/2019)   [employer] of Occupational Health - Occupational Stress Questionnaire    Feeling of Stress : To some extent  Social Connections: Moderately Integrated (09/04/2019)   Social Connection and Isolation Panel    Frequency of Communication with Friends and Family: More than three times a week    Frequency of Social Gatherings with Friends and Family: Twice a week    Attends Religious Services: More than 4 times per year    Active Member of [employer] or Organizations: No     Attends Banker Meetings: Never    Marital Status: Living with partner  Intimate Partner Violence: Not At Risk (09/04/2019)   Humiliation, Afraid, Rape, and Kick questionnaire    Fear of Current or Ex-Partner: No    Emotionally Abused: No    Physically Abused: No    Sexually Abused: No     Family History  Problem Relation Age of Onset   Depression Mother    Arthritis Mother    Heart disease Father    Diabetes Father    COPD Father    COPD Sister    Arthritis Sister    Alcohol abuse Sister    Hypertension Brother    Diabetes Brother    Breast cancer Maternal Aunt    Colon cancer Neg Hx     Current Outpatient Medications  Medication Sig Dispense Refill   ADDERALL XR 30 MG 24 hr capsule Take 30 mg by mouth every morning.     ALPRAZolam  (XANAX ) 1 MG tablet Take 1 mg by mouth 3 (three) times daily. (Patient not taking: Reported on 05/03/2023)     escitalopram  (LEXAPRO ) 20 MG tablet Take by mouth.     furosemide  (LASIX ) 40 MG tablet TAKE 0.5 TABLETS EVERY DAY BY ORAL ROUTE AS NEEDED FOR 30 DAYS.     gabapentin (NEURONTIN) 400 MG capsule Take 400 mg by mouth 3 (three) times daily.     HYDROcodone -acetaminophen  (NORCO/VICODIN) 5-325 MG tablet Take 1-2 tablets by mouth every 6 (six) hours as needed. 15 tablet 0   ibuprofen  (ADVIL ) 200 MG tablet Take 800 mg by mouth every 8 (eight) hours as needed.     INVEGA 3 MG 24 hr tablet TAKE 1 TABLET (3 MG) BY MOUTH DAILY IN THE MORNING     levothyroxine (SYNTHROID) 25 MCG tablet Take 25 mcg by mouth daily.     omeprazole  (PRILOSEC) 40 MG capsule Take 40 mg by mouth daily.     predniSONE  (DELTASONE ) 10 MG tablet Take 2 tablets (20 mg total) by mouth 2 (two) times daily. 20 tablet 0   PROAIR  HFA 108 (90 Base) MCG/ACT inhaler INHALE 2 PUFFS INTO THE LUNGS EVERY 6 HOURS AS NEEDED FOR WHEEZING ORSHORTNESS OF BREATH. 8.5 g 0   No current facility-administered medications for this visit.    Allergies  Allergen  Reactions   Penicillins  Anaphylaxis   Bee Venom Swelling    Entire body swells   Cephalexin Hives and Swelling   Lansoprazole Hives and Swelling   Tramadol  Palpitations    REVIEW OF SYSTEMS:   [X]  denotes positive finding, [ ]  denotes negative finding Cardiac  Comments:  Chest pain or chest pressure:    Shortness of breath upon exertion:    Short of breath when lying flat:    Irregular heart rhythm:        Vascular    Pain in calf, thigh, or hip brought on by ambulation:    Pain in feet at night that wakes you up from your sleep:     Blood clot in your veins:    Leg swelling:  x       Pulmonary    Oxygen at home:    Productive cough:     Wheezing:         Neurologic    Sudden weakness in arms or legs:     Sudden numbness in arms or legs:     Sudden onset of difficulty speaking or slurred speech:    Temporary loss of vision in one eye:     Problems with dizziness:         Gastrointestinal    Blood in stool:     Vomited blood:         Genitourinary    Burning when urinating:     Blood in urine:        Psychiatric    Major depression:         Hematologic    Bleeding problems:    Problems with blood clotting too easily:        Skin    Rashes or ulcers:        Constitutional    Fever or chills:      PHYSICAL EXAMINATION:  Today's Vitals   09/12/23 1428  BP: 114/79  Pulse: 89  Temp: 97.7 F (36.5 C)  TempSrc: Temporal  Weight: 273 lb 8 oz (124.1 kg)  PainSc: 0-No pain   Body mass index is 39.24 kg/m.   General:  WDWN in NAD; vital signs documented above Gait: Not observed HENT: WNL, normocephalic Pulmonary: normal non-labored breathing without wheezing Cardiac: regular HR; without carotid bruits Abdomen: soft, NT, aortic pulse is not palpable Skin: without rashes Vascular Exam/Pulses:  Right Left  Radial 2+ (normal) 2+ (normal)  DP 2+ (normal) 2+ (normal)   Extremities: mild BLE swelling  Neurologic: A&O X 3;  moving all extremities equally Psychiatric:  The  pt has Normal affect.   Non-Invasive Vascular Imaging:   Venous duplex on 07/30/2023: Venous Reflux Times  +-----+---------+------+-----------+------------+--------+  RIGHTReflux NoRefluxReflux TimeDiameter cmsComments                 Yes                                   +-----+---------+------+-----------+------------+--------+  CFV no                                              +-----+---------+------+-----------+------------+--------+     +--------------+---------+------+-----------+------------+--------+  LEFT         Reflux NoRefluxReflux TimeDiameter cmsComments  Yes                                   +--------------+---------+------+-----------+------------+--------+  CFV          no                                              +--------------+---------+------+-----------+------------+--------+  FV prox       no                                              +--------------+---------+------+-----------+------------+--------+  FV mid        no                                              +--------------+---------+------+-----------+------------+--------+  FV dist       no                                              +--------------+---------+------+-----------+------------+--------+  Popliteal    no                                              +--------------+---------+------+-----------+------------+--------+  GSV at [facility]    no                            0.54              +--------------+---------+------+-----------+------------+--------+  GSV prox thighno                            0.46              +--------------+---------+------+-----------+------------+--------+  GSV mid thigh           yes    >500 ms      0.48              +--------------+---------+------+-----------+------------+--------+  GSV dist thighno                            0.41               +--------------+---------+------+-----------+------------+--------+  GSV at knee             yes    >500 ms      0.42              +--------------+---------+------+-----------+------------+--------+  GSV prox calf           yes    >500 ms      0.34              +--------------+---------+------+-----------+------------+--------+  GSV mid calf  no  0.19              +--------------+---------+------+-----------+------------+--------+  GSV dist calf no                            0.20              +--------------+---------+------+-----------+------------+--------+  SSV at [facility]    no                            0.34              +--------------+---------+------+-----------+------------+--------+  SSV prox calf no                            0.21              +--------------+---------+------+-----------+------------+--------+  SSV mid calf  no                            0.12              +--------------+---------+------+-----------+------------+--------+   Summary:  Left:  - No evidence of deep vein thrombosis seen in the left lower extremity, from the common femoral through the popliteal veins.  - No evidence of superficial venous thrombosis in the left lower extremity.  - No evidence of superficial venous reflux seen in the left short saphenous vein. Please see noted GSV reflux above.     Shannon Reese is a 48 y.o. female who presents with: BLE swelling with left > right    -pt has easily palpable DP pedal pulses bilaterally -in the left lower extremity, the pt does not have evidence of DVT.  Pt does have venous reflux in the GSV in the mid thigh, knee and proximal calf.  She does not have deep venous reflux or reflux at the [facility].  She is not a candidate for venous laser ablation.  -discussed with pt about wearing knee high 15-20 mmHg compression stockings and pt was measured for these today and did get a pair.   Discussed putting them on before getting out of bed and taking them off at night.  -discussed the importance of leg elevation and how to elevate properly - pt is advised to elevate their legs and a diagram is given to them to demonstrate for pt to lay flat on their back with knees elevated and slightly bent with their feet higher than their knees, which puts their feet higher than their heart for 15 minutes per day.  If pt cannot lay flat, advised to lay as flat as possible.  Discussed that if she ever has bleeding from spider veins, she should get in this position and hold finger pressure for 15-20 minutes and it should stop bleeding.  -pt is advised to continue as much walking as possible and avoid sitting or standing for long periods of time.  -discussed importance of weight loss and exercise and that water aerobics would also be beneficial.  -handout with recommendations given -she does smoke and discussed the importance of smoking cessation and that smoking puts her at risk for limb loss, heart attack, stroke.   -pt will f/u as needed.  She knows that if anything changes, we would be glad to see her and re-evaluate.     Lucie Apt, [facility] [phone]  Clinic  MD:  [place]

## 2023-10-23 ENCOUNTER — Encounter (INDEPENDENT_AMBULATORY_CARE_PROVIDER_SITE_OTHER): Payer: Self-pay | Admitting: Gastroenterology

## 2023-11-11 ENCOUNTER — Encounter: Payer: Self-pay | Admitting: Radiology

## 2023-12-25 ENCOUNTER — Encounter (INDEPENDENT_AMBULATORY_CARE_PROVIDER_SITE_OTHER): Payer: Self-pay | Admitting: *Deleted

## 2024-01-06 ENCOUNTER — Encounter: Payer: MEDICAID | Admitting: Orthopedic Surgery

## 2024-03-10 ENCOUNTER — Ambulatory Visit: Payer: MEDICAID | Admitting: Internal Medicine
